# Patient Record
Sex: Female | Born: 1975 | Race: White | Hispanic: No | Marital: Single | Smoking: Former smoker
Health system: Southern US, Community
[De-identification: ages and names within clinical notes are randomized; demographics above are authoritative.]

## PROBLEM LIST (undated history)

## (undated) DIAGNOSIS — R109 Unspecified abdominal pain: Principal | ICD-10-CM

## (undated) DIAGNOSIS — M1990 Unspecified osteoarthritis, unspecified site: Secondary | ICD-10-CM

## (undated) DIAGNOSIS — N200 Calculus of kidney: Secondary | ICD-10-CM

## (undated) DIAGNOSIS — N289 Disorder of kidney and ureter, unspecified: Secondary | ICD-10-CM

## (undated) DIAGNOSIS — Z1231 Encounter for screening mammogram for malignant neoplasm of breast: Secondary | ICD-10-CM

## (undated) DIAGNOSIS — M5412 Radiculopathy, cervical region: Secondary | ICD-10-CM

## (undated) DIAGNOSIS — N3281 Overactive bladder: Secondary | ICD-10-CM

## (undated) DIAGNOSIS — Z0289 Encounter for other administrative examinations: Secondary | ICD-10-CM

## (undated) DIAGNOSIS — N8000 Endometriosis of the uterus, unspecified: Secondary | ICD-10-CM

## (undated) DIAGNOSIS — M5020 Other cervical disc displacement, unspecified cervical region: Secondary | ICD-10-CM

## (undated) HISTORY — PX: TUBAL LIGATION: SHX77

---

## 1997-10-10 ENCOUNTER — Inpatient Hospital Stay (HOSPITAL_COMMUNITY): Admission: AD | Admit: 1997-10-10 | Discharge: 1997-10-12 | Payer: Self-pay | Admitting: *Deleted

## 1999-07-08 ENCOUNTER — Emergency Department (HOSPITAL_COMMUNITY): Admission: EM | Admit: 1999-07-08 | Discharge: 1999-07-08 | Payer: Self-pay | Admitting: Emergency Medicine

## 1999-07-08 ENCOUNTER — Encounter: Payer: Self-pay | Admitting: Emergency Medicine

## 2000-07-16 ENCOUNTER — Encounter: Payer: Self-pay | Admitting: *Deleted

## 2000-07-16 ENCOUNTER — Ambulatory Visit (HOSPITAL_COMMUNITY): Admission: RE | Admit: 2000-07-16 | Discharge: 2000-07-16 | Payer: Self-pay | Admitting: *Deleted

## 2000-10-26 ENCOUNTER — Inpatient Hospital Stay (HOSPITAL_COMMUNITY): Admission: AD | Admit: 2000-10-26 | Discharge: 2000-10-26 | Payer: Self-pay | Admitting: *Deleted

## 2000-11-08 ENCOUNTER — Inpatient Hospital Stay (HOSPITAL_COMMUNITY): Admission: AD | Admit: 2000-11-08 | Discharge: 2000-11-08 | Payer: Self-pay | Admitting: *Deleted

## 2000-12-19 ENCOUNTER — Inpatient Hospital Stay (HOSPITAL_COMMUNITY): Admission: AD | Admit: 2000-12-19 | Discharge: 2000-12-21 | Payer: Self-pay | Admitting: *Deleted

## 2000-12-27 ENCOUNTER — Inpatient Hospital Stay (HOSPITAL_COMMUNITY): Admission: AD | Admit: 2000-12-27 | Discharge: 2000-12-27 | Payer: Self-pay | Admitting: *Deleted

## 2001-01-30 ENCOUNTER — Encounter: Payer: Self-pay | Admitting: *Deleted

## 2001-01-30 ENCOUNTER — Ambulatory Visit (HOSPITAL_COMMUNITY): Admission: RE | Admit: 2001-01-30 | Discharge: 2001-01-30 | Payer: Self-pay | Admitting: *Deleted

## 2002-08-11 ENCOUNTER — Emergency Department (HOSPITAL_COMMUNITY): Admission: EM | Admit: 2002-08-11 | Discharge: 2002-08-11 | Payer: Self-pay | Admitting: Emergency Medicine

## 2002-08-12 ENCOUNTER — Emergency Department (HOSPITAL_COMMUNITY): Admission: EM | Admit: 2002-08-12 | Discharge: 2002-08-12 | Payer: Self-pay | Admitting: Emergency Medicine

## 2003-02-08 ENCOUNTER — Emergency Department (HOSPITAL_COMMUNITY): Admission: EM | Admit: 2003-02-08 | Discharge: 2003-02-08 | Payer: Self-pay | Admitting: Emergency Medicine

## 2003-12-01 ENCOUNTER — Emergency Department (HOSPITAL_COMMUNITY): Admission: EM | Admit: 2003-12-01 | Discharge: 2003-12-01 | Payer: Self-pay | Admitting: Emergency Medicine

## 2003-12-21 ENCOUNTER — Emergency Department (HOSPITAL_COMMUNITY): Admission: EM | Admit: 2003-12-21 | Discharge: 2003-12-21 | Payer: Self-pay | Admitting: Emergency Medicine

## 2004-04-09 ENCOUNTER — Emergency Department (HOSPITAL_COMMUNITY): Admission: EM | Admit: 2004-04-09 | Discharge: 2004-04-09 | Payer: Self-pay | Admitting: Emergency Medicine

## 2005-02-28 ENCOUNTER — Emergency Department (HOSPITAL_COMMUNITY): Admission: EM | Admit: 2005-02-28 | Discharge: 2005-02-28 | Payer: Self-pay | Admitting: Family Medicine

## 2006-07-18 ENCOUNTER — Emergency Department (HOSPITAL_COMMUNITY): Admission: EM | Admit: 2006-07-18 | Discharge: 2006-07-18 | Payer: Self-pay | Admitting: Family Medicine

## 2006-12-16 ENCOUNTER — Emergency Department (HOSPITAL_COMMUNITY): Admission: EM | Admit: 2006-12-16 | Discharge: 2006-12-16 | Payer: Self-pay | Admitting: Family Medicine

## 2007-01-04 ENCOUNTER — Emergency Department (HOSPITAL_COMMUNITY): Admission: EM | Admit: 2007-01-04 | Discharge: 2007-01-04 | Payer: Self-pay | Admitting: Family Medicine

## 2007-01-07 ENCOUNTER — Emergency Department (HOSPITAL_COMMUNITY): Admission: EM | Admit: 2007-01-07 | Discharge: 2007-01-07 | Payer: Self-pay | Admitting: Emergency Medicine

## 2007-01-20 ENCOUNTER — Ambulatory Visit (HOSPITAL_COMMUNITY): Admission: RE | Admit: 2007-01-20 | Discharge: 2007-01-20 | Payer: Self-pay | Admitting: Orthopedic Surgery

## 2007-06-08 ENCOUNTER — Emergency Department (HOSPITAL_COMMUNITY): Admission: EM | Admit: 2007-06-08 | Discharge: 2007-06-08 | Payer: Self-pay | Admitting: Family Medicine

## 2008-03-14 ENCOUNTER — Emergency Department (HOSPITAL_COMMUNITY): Admission: EM | Admit: 2008-03-14 | Discharge: 2008-03-14 | Payer: Self-pay | Admitting: Emergency Medicine

## 2008-04-20 ENCOUNTER — Emergency Department (HOSPITAL_COMMUNITY): Admission: EM | Admit: 2008-04-20 | Discharge: 2008-04-20 | Payer: Self-pay | Admitting: Emergency Medicine

## 2008-07-16 MED ORDER — IOVERSOL 350 MG/ML IV SOLN
350 mg iodine/mL | Freq: Once | INTRAVENOUS | Status: DC
Start: 2008-07-16 — End: 2008-07-20

## 2008-07-16 MED ORDER — BARIUM SULFATE 2.2 % ORAL SUSP
2.2 % | Freq: Once | ORAL | Status: DC
Start: 2008-07-16 — End: 2008-07-20

## 2009-10-17 ENCOUNTER — Emergency Department (HOSPITAL_COMMUNITY): Admission: EM | Admit: 2009-10-17 | Discharge: 2009-10-17 | Payer: Self-pay | Admitting: Emergency Medicine

## 2009-10-30 ENCOUNTER — Emergency Department (HOSPITAL_COMMUNITY): Admission: EM | Admit: 2009-10-30 | Discharge: 2009-10-30 | Payer: Self-pay | Admitting: Emergency Medicine

## 2010-09-12 ENCOUNTER — Encounter (INDEPENDENT_AMBULATORY_CARE_PROVIDER_SITE_OTHER): Payer: Self-pay | Admitting: Emergency Medicine

## 2010-09-12 ENCOUNTER — Emergency Department (HOSPITAL_COMMUNITY)
Admission: EM | Admit: 2010-09-12 | Discharge: 2010-09-12 | Payer: Self-pay | Source: Home / Self Care | Admitting: Emergency Medicine

## 2010-09-17 ENCOUNTER — Emergency Department (HOSPITAL_COMMUNITY)
Admission: EM | Admit: 2010-09-17 | Discharge: 2010-09-17 | Payer: Self-pay | Source: Home / Self Care | Admitting: Emergency Medicine

## 2010-09-24 ENCOUNTER — Encounter: Payer: Self-pay | Admitting: Neurosurgery

## 2010-11-23 LAB — D-DIMER, QUANTITATIVE: D-Dimer, Quant: <0.22 ug/mL-FEU (ref 0.00–0.48)

## 2012-02-04 ENCOUNTER — Encounter (HOSPITAL_COMMUNITY): Payer: Self-pay | Admitting: Emergency Medicine

## 2012-02-04 ENCOUNTER — Emergency Department (HOSPITAL_COMMUNITY)
Admission: EM | Admit: 2012-02-04 | Discharge: 2012-02-04 | Disposition: A | Discharge status: Home / Self Care | Payer: Self-pay | Attending: Emergency Medicine | Admitting: Emergency Medicine

## 2012-02-04 DIAGNOSIS — J020 Streptococcal pharyngitis: Secondary | ICD-10-CM | POA: Insufficient documentation

## 2012-02-04 MED ORDER — IBUPROFEN 200 MG PO TABS: 400.0000 mg | ORAL_TABLET | Freq: Once | ORAL | Status: DC

## 2012-02-04 MED ORDER — PENICILLIN G BENZATHINE 1200000 UNIT/2ML IM SUSP
1.2000 10*6.[IU] | Freq: Once | INTRAMUSCULAR | Status: AC
  Administered 2012-02-04: 1.2 10*6.[IU] via INTRAMUSCULAR
  Filled 2012-02-04 (×2): qty 2

## 2012-02-04 MED ORDER — DEXAMETHASONE 1 MG/ML PO CONC
5.0000 mg | Freq: Once | ORAL | Status: AC
  Administered 2012-02-04: 5 mg via ORAL
  Filled 2012-02-04: qty 5

## 2012-02-04 NOTE — ED Notes (Signed)
Pt c/o sore throat for 3 days and it hurts to swallow. Today she also cough. Pt unsure if she has had a fever and has not "felt bad" just having sore throat.

## 2012-02-04 NOTE — ED Provider Notes (Signed)
History    35yf with sore throat. Gradual onset and progressively worsening over past 3 days. Denies pain anywhere else. Has been taking BC and tylenol with minor relief. Subjective fever. No n/v. No sob. No cough. No change in voice. No drooling.  CSN: [id number]  Arrival date & time 02/04/12  1015   First MD Initiated Contact with Patient 02/04/12 1029      No chief complaint on file.   (Consider location/radiation/quality/duration/timing/severity/associated sxs/prior treatment) HPI  History reviewed. No pertinent past medical history.  History reviewed. No pertinent past surgical history.  No family history on file.  History  Substance Use Topics  . Smoking status: Never Smoker   . Smokeless tobacco: Not on file  . Alcohol Use: No    OB History    Grav Para Term Preterm Abortions TAB SAB Ect Mult Living                  Review of Systems   Review of symptoms negative unless otherwise noted in HPI.   Allergies  Codeine  Home Medications  No current outpatient prescriptions on file.  BP 135/62  Pulse 70  Temp 98.7 F (37.1 C)  Resp 18  SpO2 98%  LMP 02/04/2012  Physical Exam  Nursing note and vitals reviewed. Constitutional: She appears well-developed and well-nourished. No distress.  HENT:  Head: Normocephalic and atraumatic.  Mouth/Throat: Oropharyngeal exudate present.       B/l tonsillar exudate. Posterior pharynx injected. Uvula midline. Handling secretions. No stridor. No hoarsenss. No tongue elevation. Submental tissues soft. No adenopathy appreciated. Neck supple.  Eyes: Conjunctivae are normal. Right eye exhibits no discharge. Left eye exhibits no discharge.  Neck: Neck supple.  Cardiovascular: Normal rate, regular rhythm and normal heart sounds.  Exam reveals no gallop and no friction rub.   No murmur heard. Pulmonary/Chest: Effort normal and breath sounds normal. No respiratory distress.  Musculoskeletal: She exhibits no edema and no  tenderness.  Neurological: She is alert.  Skin: Skin is warm and dry.  Psychiatric: She has a normal mood and affect. Her behavior is normal. Thought content normal.    ED Course  Procedures (including critical care time)  Labs Reviewed - No data to display No results found.   1. Streptococcal pharyngitis       MDM  35yF with sore throat. 3/4 centor criteria if including reported fever. Will tx for strep based on this. No evidence of deep space infection. Pt agreeable to bicillin. nsaid and dose decadron for symptomatic relief. Return precautions discussed. outpt fu as needed.        Raeford Razor, MD 02/04/12 1121

## 2012-02-04 NOTE — Discharge Instructions (Signed)
Strep Throat Strep throat is an infection of the throat caused by a bacteria named Streptococcus pyogenes. Your caregiver may call the infection streptococcal "tonsillitis" or "pharyngitis" depending on whether there are signs of inflammation in the tonsils or back of the throat. Strep throat is most common in children from 68 to 38 years old during the cold months of the year, but it can occur in people of any age during any season. This infection is spread from person to person (contagious) through coughing, sneezing, or other close contact. SYMPTOMS   Fever or chills.   Painful, swollen, red tonsils or throat.   Pain or difficulty when swallowing.   White or yellow spots on the tonsils or throat.   Swollen, tender lymph nodes or "glands" of the neck or under the jaw.   Red rash all over the body (rare).  DIAGNOSIS  Many different infections can cause the same symptoms. A test must be done to confirm the diagnosis so the right treatment can be given. A "rapid strep test" can help your caregiver make the diagnosis in a few minutes. If this test is not available, a light swab of the infected area can be used for a throat culture test. If a throat culture test is done, results are usually available in a day or two. TREATMENT  Strep throat is treated with antibiotic medicine. HOME CARE INSTRUCTIONS   Gargle with 1 tsp of salt in 1 cup of warm water, 3 to 4 times per day or as needed for comfort.   Family members who also have a sore throat or fever should be tested for strep throat and treated with antibiotics if they have the strep infection.   Make sure everyone in your household washes their hands well.   Do not share food, drinking cups, or personal items that could cause the infection to spread to others.   You may need to eat a soft food diet until your sore throat gets better.   Drink enough water and fluids to keep your urine clear or pale yellow. This will help prevent  dehydration.   Get plenty of rest.   Stay home from school, daycare, or work until you have been on antibiotics for 24 hours.   Only take over-the-counter or prescription medicines for pain, discomfort, or fever as directed by your caregiver.   If antibiotics are prescribed, take them as directed. Finish them even if you start to feel better.  SEEK MEDICAL CARE IF:   The glands in your neck continue to enlarge.   You develop a rash, cough, or earache.   You cough up green, yellow-brown, or bloody sputum.   You have pain or discomfort not controlled by medicines.   Your problems seem to be getting worse rather than better.  SEEK IMMEDIATE MEDICAL CARE IF:   You develop any new symptoms such as vomiting, severe headache, stiff or painful neck, chest pain, shortness of breath, or trouble swallowing.   You develop severe throat pain, drooling, or changes in your voice.   You develop swelling of the neck, or the skin on the neck becomes red and tender.   You have a fever.   You develop signs of dehydration, such as fatigue, dry mouth, and decreased urination.   You become increasingly sleepy, or you cannot wake up completely.  Document Released: 08/17/2000 Document Revised: 08/09/2011 Document Reviewed: 10/19/2010 [facility] Patient Information 2012 [place], [place].  RESOURCE GUIDE  Chronic Pain Problems: Contact [facility]  [phone] Patients need to be referred by their primary care doctor.  Insufficient Money for Medicine: Contact United Way:  call "211" or [facility] 440-758-1291.  No Primary Care Doctor: - Call [facility]  [phone] - can help you locate a primary care doctor that  accepts your insurance, provides certain services, etc. - Physician Referral Service- [phone]  Agencies that provide inexpensive medical care: - [facility]  [phone] - [facility]  [phone] - [facility]  [phone] - Women's Clinic  [phone] - Planned Parenthood  [phone] [facility]  [phone]  Medicaid-accepting [facility] Providers: - [facility]- [address]  [phone], Mon-Fri 9am-7pm, Sat 9am-1pm - [facility]- [address] [place], [address] [place]  [phone] - [facility]- [address] [place]  [phone] [facility]- [address]  [phone] - Renaye Rakers- [address] [place], [address], [phone]  Only accepts [place] patients after they have their name  applied to their card  Self Pay (no insurance) in [place]: - Sickle Cell Patients: Dr Willey Blade, [facility]  [address] [place], [phone] - [facility]- [address] [place]  [phone]       [facility]- [address]       -     [facility]- see information above (Speak to [employer] if you do not have insurance)       -  [facility]- [address] [place], [phone]       -  [facility]- [address],  [phone]       -  [facility]- [address], [phone]       -  Dr Julio Sicks-  [address], [place], [phone]       -  [facility]- [address], [phone]       -  [facility]- [address] [phone], also [address], [phone]       -    [facility]- [address] [place], [phone], 1st & 3rd Saturday   every month, 10am-1pm  1) Find a Doctor and Pay Out of Pocket Although you won't have to find out who is covered by your insurance plan, it is a good idea to ask around and get recommendations. You will then need to call the office and see if the doctor you have chosen will accept you as a new patient and what types of options they offer for patients who are self-pay. Some doctors offer discounts or will  set up payment plans for their patients who do not have insurance, but you will need to ask so you aren't surprised when you get to your appointment.  2) Contact Your Local Health Department Not all health departments have doctors that can see patients for sick visits, but many do, so it is worth a call to see if yours does. If you don't know where your local health department is, you can check in your phone book. The CDC also has a tool to help you locate your state's health department, and many state websites also have listings of all of their local health departments.  3) Find a Walk-in Clinic If your illness is not likely to be very severe or complicated, you may want to try a walk in clinic. These are popping up all over the country in pharmacies, drugstores, and shopping centers. They're usually staffed by nurse practitioners or physician assistants that have been trained to treat common illnesses and complaints. They're usually fairly quick and inexpensive. However, if you have serious medical issues or chronic medical problems, these are probably not your best option  STD Testing - [facility] Department of [facility], STD Clinic, [address], [place], phone [phone] or [phone].  Monday - Friday, call for an appointment. [facility] [place] Department of [employer], STD Clinic, [address], [place], phone [phone] or [phone].  Monday - Friday, call for an appointment.  Abuse/Neglect: [facility] Child Abuse Hotline 3342443444 [facility] Child Abuse Hotline (317) 127-1401 (After Hours)  Emergency Shelter:  [facility] 272-432-5871  Maternity Homes: - [facility] at [facility] [place] [facility] 803-333-2139 - [facility] [phone]  MRSA Hotline #:   867-006-4595  [facility] Resources  [facility] [place]  [employer] [facility] Dept. [address]                  [address]         [address] [place] [address]  Blondell Reveal Phone:  [phone]                                  Phone:  [phone]                   Phone:  [phone]  [facility], [phone] - [phone]       -     [facility] in [place], [address]                                  [phone], [facility] Child Abuse Hotline 313-285-1421 or (701)502-2130 (After Hours)   Behavioral Health Services  Substance Abuse Resources: - [facility] Drug [facility]  832-111-1070 - [facility] (929)368-5249 - [facility] [phone] Floydene Flock [phone] - Residential & Outpatient Substance Abuse Program  6782426249  Psychological Services: [facility]  [phone] Services  [phone] - [facility], [phone] [place]. [address], [place], ACCESS LINE: 719-767-8465 or 5597667307, [email or web address]  Dental Assistance  If unable to pay or uninsured, contact:  [facility] or [facility]. to become qualified for the adult dental clinic.  Patients with Medicaid: [facility] [phone], [phone] [address] [phone]  If unable to pay, or uninsured, contact [facility] [phone]) or [facility]  Health Department [phone] in [place], [phone] in [facility]) to become qualified for the adult dental clinic  Other Low-Cost Community Dental Services: - [facility]- [address] [place], [place], [place], [address], [phone], [phone], 2nd and 4th Thursday of the month at 6:30am.  10 clients each day by appointment, can sometimes see walk-in patients if someone does not show for an appointment. [facility]-  [address] [place]ins [place], [place], [address], [phone] - [facility]- [address] [place], [place], [address], [phone] - [facility]- [phone] [facility] Health Department- [phone] [facility] Department- [phone]

## 2012-05-05 ENCOUNTER — Encounter (HOSPITAL_COMMUNITY): Payer: Self-pay | Admitting: *Deleted

## 2012-05-05 ENCOUNTER — Emergency Department (HOSPITAL_COMMUNITY)
Admission: EM | Admit: 2012-05-05 | Discharge: 2012-05-05 | Disposition: A | Discharge status: Home / Self Care | Payer: Self-pay | Attending: Emergency Medicine | Admitting: Emergency Medicine

## 2012-05-05 DIAGNOSIS — R609 Edema, unspecified: Secondary | ICD-10-CM | POA: Insufficient documentation

## 2012-05-05 DIAGNOSIS — R600 Localized edema: Secondary | ICD-10-CM

## 2012-05-05 DIAGNOSIS — Z886 Allergy status to analgesic agent status: Secondary | ICD-10-CM | POA: Insufficient documentation

## 2012-05-05 LAB — URINALYSIS, ROUTINE W REFLEX MICROSCOPIC
Bilirubin Urine: NEGATIVE
Hgb urine dipstick: NEGATIVE
Ketones, ur: NEGATIVE mg/dL
Nitrite: NEGATIVE
Protein, ur: NEGATIVE mg/dL
Urobilinogen, UA: 1 mg/dL (ref 0.0–1.0)

## 2012-05-05 LAB — POCT I-STAT, CHEM 8
Calcium, Ion: 1.24 mmol/L — ABNORMAL HIGH (ref 1.12–1.23)
Creatinine, Ser: 0.7 mg/dL (ref 0.50–1.10)
Glucose, Bld: 91 mg/dL (ref 70–99)
HCT: 35 % — ABNORMAL LOW (ref 36.0–46.0)
Hemoglobin: 11.9 g/dL — ABNORMAL LOW (ref 12.0–15.0)
Potassium: 3.9 mEq/L (ref 3.5–5.1)

## 2012-05-05 LAB — PREGNANCY, URINE: Preg Test, Ur: NEGATIVE

## 2012-05-05 NOTE — ED Provider Notes (Signed)
History     CSN: [id number]  Arrival date & time 05/05/12  1048   First MD Initiated Contact with Patient 05/05/12 1116      Chief Complaint  Patient presents with  . Leg Swelling     HPI Pt was seen at 1130.  Per pt, c/o gradual onset and persistence of constant bilat feet "swelling" for the past 1 week.  Denies recent long travel, no calf/LE pain or unilateral swelling, no fevers, no rash, no CP/SOB, no focal motor weakness, no tingling/numbness in extremities, no abd pain, no back pain.     History reviewed. No pertinent past medical history.  History reviewed. No pertinent past surgical history.   History  Substance Use Topics  . Smoking status: Never Smoker   . Smokeless tobacco: Not on file  . Alcohol Use: No    Review of Systems ROS: Statement: All systems negative except as marked or noted in the HPI; Constitutional: Negative for fever and chills. ; ; Eyes: Negative for eye pain, redness and discharge. ; ; ENMT: Negative for ear pain, hoarseness, nasal congestion, sinus pressure and sore throat. ; ; Cardiovascular: Negative for chest pain, palpitations, diaphoresis, dyspnea and +peripheral edema. ; ; Respiratory: Negative for cough, wheezing and stridor. ; ; Gastrointestinal: Negative for nausea, vomiting, diarrhea, abdominal pain, blood in stool, hematemesis, jaundice and rectal bleeding. . ; ; Genitourinary: Negative for dysuria, flank pain and hematuria. ; ; Musculoskeletal: Negative for back pain and neck pain. Negative for swelling and trauma.; ; Skin: Negative for pruritus, rash, abrasions, blisters, bruising and skin lesion.; ; Neuro: Negative for headache, lightheadedness and neck stiffness. Negative for weakness, altered level of consciousness , altered mental status, extremity weakness, paresthesias, involuntary movement, seizure and syncope.     Allergies  Codeine  Home Medications   Current Outpatient Rx  Name Route Sig Dispense Refill  . IBUPROFEN 200 MG  PO TABS Oral Take 400 mg by mouth every 6 (six) hours as needed. Pain    . ADULT MULTIVITAMIN W/MINERALS CH Oral Take 1 tablet by mouth daily.      BP 118/62  Pulse 66  Temp 98.8 F (37.1 C) (Oral)  SpO2 98%  LMP 04/28/2012  Physical Exam 1135: Physical examination:  Nursing notes reviewed; Vital signs and O2 SAT reviewed;  Constitutional: Well developed, Well nourished, Well hydrated, In no acute distress; Head:  Normocephalic, atraumatic; Eyes: EOMI, PERRL, No scleral icterus; ENMT: Mouth and pharynx normal, Mucous membranes moist; Neck: Supple, Full range of motion, No lymphadenopathy; Cardiovascular: Regular rate and rhythm, No murmur, rub, or gallop; Respiratory: Breath sounds clear & equal bilaterally, No rales, rhonchi, wheezes.  Speaking full sentences with ease, Normal respiratory effort/excursion; Chest: Nontender, Movement normal; Abdomen: Soft, Nontender, Nondistended, Normal bowel sounds;; Extremities: Pulses normal, No tenderness, Trace to +1 bilat pedal edema to feet and ankles. No calf edema or asymmetry.; Neuro: AA&Ox3, Major CN grossly intact.  Speech clear. No gross focal motor or sensory deficits in extremities.; Skin: Color normal, Warm, Dry.   ED Course  Procedures    MDM  MDM Reviewed: nursing note and vitals Interpretation: labs     Results for orders placed during the hospital encounter of 05/05/12  PREGNANCY, URINE      Component Value Range   Preg Test, Ur NEGATIVE  NEGATIVE  URINALYSIS, ROUTINE W REFLEX MICROSCOPIC      Component Value Range   Color, Urine YELLOW  YELLOW   APPearance CLEAR  CLEAR   Specific  Gravity, Urine 1.022  1.005 - 1.030   pH 7.0  5.0 - 8.0   Glucose, UA NEGATIVE  NEGATIVE mg/dL   Hgb urine dipstick NEGATIVE  NEGATIVE   Bilirubin Urine NEGATIVE  NEGATIVE   Ketones, ur NEGATIVE  NEGATIVE mg/dL   Protein, ur NEGATIVE  NEGATIVE mg/dL   Urobilinogen, UA 1.0  0.0 - 1.0 mg/dL   Nitrite NEGATIVE  NEGATIVE   Leukocytes, UA NEGATIVE   NEGATIVE  POCT I-STAT, CHEM 8      Component Value Range   Sodium 139  135 - 145 mEq/L   Potassium 3.9  3.5 - 5.1 mEq/L   Chloride 104  96 - 112 mEq/L   BUN 9  6 - 23 mg/dL   Creatinine, Ser 1.61  0.50 - 1.10 mg/dL   Glucose, Bld 91  70 - 99 mg/dL   Calcium, Ion 0.96 (*) 1.12 - 1.23 mmol/L   TCO2 25  0 - 100 mmol/L   Hemoglobin 11.9 (*) 12.0 - 15.0 g/dL   HCT 04.5 (*) 40.9 - 81.1 %     1340:  No acute findings on labs.  Low risk Wells' criteria and bilat feet swelling (no unilateral leg pain/swelling); doubt DVT .  Dx testing d/w pt and family.  Questions answered.  Verb understanding, agreeable to d/c home with outpt f/u.          Laray Anger, DO 05/08/12 1427

## 2012-05-05 NOTE — ED Notes (Signed)
MD at bedside. 

## 2012-05-05 NOTE — ED Notes (Signed)
Last week noted lower legs/ feet swelling, no injury, continues to swell even with propping ext up

## 2012-09-10 LAB — URINALYSIS W/ RFLX MICROSCOPIC
Bacteria: 0 /HPF
Bilirubin: NEGATIVE
Casts: 0 /LPF
Glucose: NEGATIVE MG/DL
Ketone: NEGATIVE MG/DL
Leukocyte Esterase: NEGATIVE
Nitrites: NEGATIVE
Protein: NEGATIVE MG/DL
Specific gravity: 1.011 (ref 1.001–1.023)
Urobilinogen: 0.2 EU/DL (ref 0.2–1.0)
WBC: 0 /HPF
pH (UA): 7.5 (ref 5.0–9.0)

## 2012-09-10 LAB — METABOLIC PANEL, BASIC
Anion gap: 5 mmol/L — ABNORMAL LOW (ref 7–16)
BUN: 10 MG/DL (ref 6–23)
CO2: 31 MMOL/L (ref 21–32)
Calcium: 10.1 MG/DL (ref 8.3–10.4)
Chloride: 104 MMOL/L (ref 98–107)
Creatinine: 0.54 MG/DL — ABNORMAL LOW (ref 0.6–1.0)
GFR est AA: >60 mL/min/{1.73_m2} (ref >60)
GFR est non-AA: >60 mL/min/{1.73_m2} (ref >60)
Glucose: 78 MG/DL (ref 65–100)
Potassium: 4.1 MMOL/L (ref 3.5–5.1)
Sodium: 140 MMOL/L (ref 136–145)

## 2012-09-10 LAB — CBC WITH AUTOMATED DIFF
ABS. BASOPHILS: 0 10*3/uL (ref 0.0–0.2)
ABS. EOSINOPHILS: 0.2 10*3/uL (ref 0.0–0.8)
ABS. IMM. GRANS.: 0 10*3/uL (ref 0.0–0.5)
ABS. LYMPHOCYTES: 2.8 10*3/uL (ref 0.5–4.6)
ABS. MONOCYTES: 0.7 10*3/uL (ref 0.1–1.3)
ABS. NEUTROPHILS: 5.6 10*3/uL (ref 1.7–8.2)
BASOPHILS: 0 % (ref 0.0–2.0)
EOSINOPHILS: 3 % (ref 0.5–7.8)
HCT: 39.2 % (ref 35.8–46.3)
HGB: 12.7 g/dL (ref 11.7–15.4)
IMMATURE GRANULOCYTES: 0.1 % (ref 0.0–5.0)
LYMPHOCYTES: 30 % (ref 13–44)
MCH: 29.2 PG (ref 26.1–32.9)
MCHC: 32.4 g/dL (ref 31.4–35.0)
MCV: 90.1 FL (ref 79.6–97.8)
MONOCYTES: 8 % (ref 4.0–12.0)
MPV: 10.2 FL — ABNORMAL LOW (ref 10.8–14.1)
NEUTROPHILS: 59 % (ref 43–78)
PLATELET: 332 10*3/uL (ref 150–450)
RBC: 4.35 M/uL (ref 4.05–5.25)
RDW: 12.4 % (ref 11.9–14.6)
WBC: 9.3 10*3/uL (ref 4.3–11.1)

## 2012-09-10 LAB — MSSA/MRSA SC BY PCR, NASAL SWAB

## 2012-09-10 NOTE — Progress Notes (Signed)
Preoperative Assessment Spine Surgery    Surgeon:   Toney Sang, MD    Date of Surgery:  09/15/12    Surgery Planned:    Anterior Cervical Discectomy and Fusion                    Anesthesia Plan: General Anesthesia    Primary Care Physician:   [facility]    Specialty Physician(s):      Subjective:     Marissa Perry is a 37 y.o. Caucasian female who presents for preoperative risk assessment evaluation for the above mentioned surgery on 09/15/12.      Past Medical History   Diagnosis Date   ??? Anxiety      no present treatment       Past Surgical History   Procedure Laterality Date   ??? Hx cesarean section  1993     Family History   Problem Relation Age of Onset   ??? Hypertension Father       History   Substance Use Topics   ??? Smoking status: Former Smoker -- 0.50 packs/day for 6 years   ??? Smokeless tobacco: Never Used      Comment: stopped in 2013   ??? Alcohol Use: Yes      Comment: Rare       Prior to Admission medications    Medication Sig Start Date End Date Taking? Authorizing Provider   cyclobenzaprine (FLEXERIL) 10 mg tablet Take 10 mg by mouth three (3) times daily as needed for Muscle Spasm(s).    Historical Provider   norethindrone-ethinyl estradiol (BALZIVA, 28,) 0.4-35 mg-mcg per tablet Take 1 Tab by mouth every morning.    Historical Provider   DM/P-EPHED/ACETAMINOPH/DOXYLAM (NIGHT TIME COLD FORMULA PO) Take  by mouth as needed.    Historical Provider     No Known Allergies     Review of Systems   Denies Past Medical History of:  AMI, CHF, Valve disorder, PVD, TIA, CVA, DVT, Pulmonary Embolism, COPD, asthma, kidney disease, known bleeding disorder or anemia.                            Constitutional: Negative for fever, chills, weight loss and malaise/fatigue.                           HENT: Negative for hearing loss. does not use hearing aids   Eyes: Negative for blurred vision. does not wear glasses   Respiratory: Negative for cough, shortness of breath and wheezing.                              Cardiovascular: Negative for chest pain, palpitations, orthopnea and leg swelling.              Gastrointestinal: Negative for heartburn, nausea, vomiting, diarrhea, constipation and blood in stool.                            Genitourinary: Negative for dysuria, urgency, frequency and hematuria.                             Musculoskeletal: Negative for falls. See HPI  Skin: Negative for itching and rash.                            Neurological: Negative for dizziness and seizures.                             Endo/Heme/Allergies: Negative for polydipsia. Does not bruise/bleed easily.                     Psychiatric/Behavioral: Negative for depression, hallucinations, memory loss and substance abuse. The patient is not nervous/anxious.                             Objective:     Physical Exam:   Blood pressure 112/68, pulse 80, temperature 98.8 ??F (37.1 ??C), resp. rate 16, height 5\' 3"  (1.6 m), weight 84.369 kg (186 lb), last menstrual period 09/09/2012.  Patient Vitals for the past 8 hrs:   BP Temp Pulse Resp Height Weight   09/10/12 1049 - - - - 5\' 3"  (1.6 m) 84.369 kg (186 lb)   09/10/12 1037 112/68 mmHg 98.8 ??F (37.1 ??C) 80 16 - -     Constitutional: Oriented to person, place, and time. Appears well-developed and well-nourished.   Head: Normocephalic and atraumatic.   Eyes: Conjunctivae are normal. No scleral icterus.   Neck: Normal range of motion. Neck supple. Carotid bruit is not present. No tracheal deviation present.   Cardiovascular: Normal rate, regular rhythm and normal heart sounds.    Pulmonary/Chest: Effort normal and breath sounds normal.   Abdominal: Soft. Bowel sounds are normal.   Musculoskeletal: Exhibits no edema.   Neurological: Alert and oriented to person, place, and time.   Skin: Skin is warm and dry. No rash noted.   Psychiatric: Has a normal mood and affect. Behavior is normal.     Data Review:   Recent Results (from the past 24 hour(s))   URINALYSIS W/ RFLX  MICROSCOPIC    Collection Time    09/10/12 10:30 AM       Result Value Range    Color YELLOW      Appearance CLEAR      Specific gravity 1.011  1.001 - 1.023      pH 7.5  5.0 - 9.0      Protein NEGATIVE   NEGATIVE MG/DL    Glucose NEGATIVE       Ketone NEGATIVE   NEGATIVE MG/DL    Bilirubin NEGATIVE   NEGATIVE    Blood MODERATE (*) NEGATIVE    Urobilinogen 0.2  0.2 - 1.0 EU/DL    Nitrites NEGATIVE   NEGATIVE    Leukocyte Esterase NEGATIVE   NEGATIVE    WBC 0  0 /HPF    RBC 3-5  0 /HPF    Epithelial cells 0-3  0 /HPF    Bacteria 0  0 /HPF    Casts 0  0 /LPF   CBC WITH AUTOMATED DIFF    Collection Time    09/10/12 10:35 AM       Result Value Range    WBC 9.3  4.3 - 11.1 K/uL    RBC 4.35  4.05 - 5.25 M/uL    HGB 12.7  11.7 - 15.4 g/dL    HCT 16.1  09.6 - 04.5 %    MCV 90.1  79.6 - 97.8  FL    MCH 29.2  26.1 - 32.9 PG    MCHC 32.4  31.4 - 35.0 g/dL    RDW 54.0  98.1 - 19.1 %    PLATELET 332  150 - 450 K/uL    MPV 10.2 (*) 10.8 - 14.1 FL    DF AUTOMATED      NEUTROPHILS 59  43 - 78 %    LYMPHOCYTES 30  13 - 44 %    MONOCYTES 8  4.0 - 12.0 %    EOSINOPHILS 3  0.5 - 7.8 %    BASOPHILS 0  0.0 - 2.0 %    IMMATURE GRANULOCYTES 0.1  0.0 - 5.0 %    ABS. NEUTROPHILS 5.6  1.7 - 8.2 K/UL    ABS. LYMPHOCYTES 2.8  0.5 - 4.6 K/UL    ABS. MONOCYTES 0.7  0.1 - 1.3 K/UL    ABS. EOSINOPHILS 0.2  0.0 - 0.8 K/UL    ABS. BASOPHILS 0.0  0.0 - 0.2 K/UL    ABS. IMM. GRANS. 0.0  0.0 - 0.5 K/UL   METABOLIC PANEL, BASIC    Collection Time    09/10/12 10:35 AM       Result Value Range    Sodium 140  136 - 145 MMOL/L    Potassium 4.1  3.5 - 5.1 MMOL/L    Chloride 104  98 - 107 MMOL/L    CO2 31  21 - 32 MMOL/L    Anion gap 5 (*) 7 - 16 mmol/L    Glucose 78  65 - 100 MG/DL    BUN 10  6 - 23 MG/DL    Creatinine 4.78 (*) 0.6 - 1.0 MG/DL    GFR est AA >29  >56 ml/min/1.3m2    GFR est non-AA >60  >60 ml/min/1.58m2    Calcium 10.1  8.3 - 10.4 MG/DL   MSSA/MRSA SC BY PCR, NASAL SWAB    Collection Time    09/10/12 10:40 AM       Result Value Range    Specimen  Description: NASAL SWAB      Special Requests: NO SPECIAL REQUESTS      Culture result:        Value: MRSA target DNA not detected, SA target DNA detected.   A MRSA negative, SA positive test result does not preclude MRSA nasal colonization.    Report Status 09/10/2012 FINAL       No results found for this basename: HBA1C         Risk Assessment:     Duke Activity Index: Climb a flight of stairs or walk uphill? (5.50 METS)   Major Cardiac Risk: None does not have Active Cardiac Conditions   Intermediate Cardiac Risk: None Risk Assessment = 0  >2 consider B-Blocker and EKG   Minor Cardiac Risk: None does not have minor risk predictors   STOP-Bang Screen for Sleep Apnea: none      Pulmonary Risk: None Pulmonary Flags:None   Renal Risk: None Renal Flags:None   Delirium Risk: None Delirium Flags:None   Ileus Risk: None    DVT Risk: oral contraceptive therapy    UTI Risk:         Assessment / Plan     Problem List:  There is no problem list on file for this patient.        Cardiac Risk Assessment: The patient does not have active cardiac abnormalities noted.   The patient is not a risk for  cardiac complications.   Consults needed: NO    DVT Risk Factors: The patient is a risk for DVT. Birth Control    Pulmonary Risk Assessment:  The patient is not a risk for pulmonary complications.     Renal Risk Assessment:  The patient is not a risk for renal complications.     Delirium Risk Assessment:  The patient is not a risk for delirium.     Ileus Risk Assessment:  The patient is not a risk for postoperative ileus.     Fall Risk Assessment:  No fall risk factors  Morse Fall Scale = 0. Patient is not a fall risk.    Plan:           1. DVT precautions secondary to birth control  2. Check labs - wnl    Signed By: Wilmon Pali, PA-C    September 10, 2012

## 2012-09-10 NOTE — Other (Signed)
Delayed entry: Phone assessment completed. Verbalizes understanding of all preop instructions including place of arrival.  Instructed on NPO past midnight which includes gum, mints, tobacco, ice chips or water.  Aware to have responsible adult drive patient to and from the hospital, stay during entire surgery and to stay with the patient for 24 hours postoperatively. Also instructed to leave all valuables at home, no jewelry. Piercings must be removed prior to surgery.    Patient will be called the day prior to surgery (if Monday, pt will be called on Friday) by preop nurse between 1:00-5:00 PM with arrival time.  Patient is to call [phone] if no one calls them by 5:00 PM day prior to surgery.    Educations sheets on pain management , hand hygiene, smoking cessation ,infection prevention tips along with mupirocin ointment instruction sheet and shower instruction sheet in patient's chart to be given at time of pre-assessment appointment.    Instructed to call surgeon's office if needs to cancel surgery.    Informed to [facility]  with antibacterial soap for 3 days prior to surgery .Instructed to shower with hibiclens- avoiding face and genitals - on the night before and the am of surgery. Let hibiclens sit on skin for 20 seconds and rinse well. No lotion, oil, powders or colognes.Instructed on importance of handwashing. Wear freshly laundered clothing in to hospital, have clean bed linen on bed and do not allow pets to sleep or lay with them. Patient verbalized understanding of all of the above.          Instructed patient to continue previous medications as prescribed prior to surgery and to take [facility]  on the day of surgery with only a sip of water.     Instructed to hold the following medications prior to surgery:none    Type of surgery:3  Labs per surgeon:to be drawn on Prehab day: CBC,BMP, U/A, MRSA  Labs per anesthesia pre-assessment protocol:no added   EKG :none

## 2012-09-10 NOTE — Other (Signed)
MRSA positive for SA only-    Mupirocin nasal ointment 22 GM tube called to [facility] in [place] with instructions pt is to use BID x 5 days to total 10 doses.  Notified pt and instructed on how to administer.  Pt will start tonight.  Notified MD office- spoke to Mason .

## 2012-09-10 NOTE — Other (Signed)
Pt here to view spine video.  MRSA swab done upon arrival.    Pt also had labs, received written instructions including NPO, medications to take on day of surgery; time and place to arrive, use of Hibiclens. Incentive spirometer demonstrated satisfactorily.      All handouts given and pt verbalized understanding of all and had no further questions.    Pt was also evaluated by PA Amy Hunt

## 2012-09-15 ENCOUNTER — Inpatient Hospital Stay
Admit: 2012-09-15 | Discharge: 2012-09-16 | Disposition: A | Discharge status: Home / Self Care | Payer: BLUE CROSS/BLUE SHIELD | Attending: Neurological Surgery | Admitting: Neurological Surgery

## 2012-09-15 DIAGNOSIS — M5020 Other cervical disc displacement, unspecified cervical region: Secondary | ICD-10-CM

## 2012-09-15 LAB — HCG URINE, QL. - POC: Pregnancy test,urine (POC): NEGATIVE

## 2012-09-15 LAB — GLUCOSE, POC: Glucose (POC): 91 mg/dL (ref 65–100)

## 2012-09-15 MED ORDER — LACTATED RINGERS IV
INTRAVENOUS | Status: DC
Start: 2012-09-15 — End: 2012-09-16
  Administered 2012-09-15 – 2012-09-16 (×2): via INTRAVENOUS

## 2012-09-15 MED ORDER — FENTANYL CITRATE (PF) 50 MCG/ML IJ SOLN
50 mcg/mL | INTRAMUSCULAR | Status: DC | PRN
Start: 2012-09-15 — End: 2012-09-15

## 2012-09-15 MED ORDER — CEFAZOLIN 2 GRAM/50 ML NS IVPB
Freq: Three times a day (TID) | INTRAVENOUS | Status: AC
Start: 2012-09-15 — End: 2012-09-16
  Administered 2012-09-15 – 2012-09-16 (×3): via INTRAVENOUS

## 2012-09-15 MED ORDER — MORPHINE 2 MG/ML INJECTION
2 mg/mL | INTRAMUSCULAR | Status: DC | PRN
Start: 2012-09-15 — End: 2012-09-16
  Administered 2012-09-16: 05:00:00 via INTRAVENOUS

## 2012-09-15 MED ORDER — NORETHINDRONE 0.4 MG-ETHINYL ESTRADIOL 35 MCG TABLET
Freq: Every day | ORAL | Status: DC
Start: 2012-09-15 — End: 2012-09-15

## 2012-09-15 MED ORDER — LACTATED RINGERS IV
INTRAVENOUS | Status: DC
Start: 2012-09-15 — End: 2012-09-15
  Administered 2012-09-15: 15:00:00 via INTRAVENOUS

## 2012-09-15 MED ORDER — LIDOCAINE-EPINEPHRINE 1 %-1:100,000 IJ SOLN
1 %-:00,000 | INTRAMUSCULAR | Status: AC
Start: 2012-09-15 — End: ?

## 2012-09-15 MED ORDER — FENTANYL CITRATE (PF) 50 MCG/ML IJ SOLN
50 mcg/mL | INTRAMUSCULAR | Status: DC | PRN
Start: 2012-09-15 — End: 2012-09-15
  Administered 2012-09-15 (×4): via INTRAVENOUS

## 2012-09-15 MED ORDER — DOCUSATE SODIUM 100 MG CAP
100 mg | Freq: Two times a day (BID) | ORAL | Status: DC
Start: 2012-09-15 — End: 2012-09-16
  Administered 2012-09-15 – 2012-09-16 (×3): via ORAL

## 2012-09-15 MED ORDER — ZOLPIDEM 5 MG TAB
5 mg | Freq: Every evening | ORAL | Status: DC | PRN
Start: 2012-09-15 — End: 2012-09-16
  Administered 2012-09-16: 05:00:00 via ORAL

## 2012-09-15 MED ORDER — LIDOCAINE (PF) 20 MG/ML (2 %) IJ SOLN
20 mg/mL (2 %) | INTRAMUSCULAR | Status: DC | PRN
Start: 2012-09-15 — End: 2012-09-15
  Administered 2012-09-15: 12:00:00 via INTRAVENOUS

## 2012-09-15 MED ORDER — DIPHENHYDRAMINE HCL 50 MG/ML IJ SOLN
50 mg/mL | INTRAMUSCULAR | Status: DC | PRN
Start: 2012-09-15 — End: 2012-09-15

## 2012-09-15 MED ORDER — CEFAZOLIN 1 GRAM SOLUTION FOR INJECTION
1 gram | INTRAMUSCULAR | Status: AC
Start: 2012-09-15 — End: ?

## 2012-09-15 MED ORDER — FENTANYL CITRATE (PF) 50 MCG/ML IJ SOLN
50 mcg/mL | Freq: Once | INTRAMUSCULAR | Status: DC
Start: 2012-09-15 — End: 2012-09-15
  Administered 2012-09-15: 11:00:00 via INTRAVENOUS

## 2012-09-15 MED ORDER — FAMOTIDINE (PF) 20 MG/2 ML IV
20 mg/2 mL | Freq: Once | INTRAVENOUS | Status: DC
Start: 2012-09-15 — End: 2012-09-15
  Administered 2012-09-15: 11:00:00 via INTRAVENOUS

## 2012-09-15 MED ORDER — ONDANSETRON (PF) 4 MG/2 ML INJECTION
4 mg/2 mL | INTRAMUSCULAR | Status: DC | PRN
Start: 2012-09-15 — End: 2012-09-16

## 2012-09-15 MED ORDER — ONDANSETRON (PF) 4 MG/2 ML INJECTION
4 mg/2 mL | Freq: Once | INTRAMUSCULAR | Status: DC
Start: 2012-09-15 — End: 2012-09-15
  Administered 2012-09-15: 15:00:00 via INTRAVENOUS

## 2012-09-15 MED ORDER — CEFAZOLIN 1 GRAM SOLUTION FOR INJECTION
1 gram | INTRAMUSCULAR | Status: DC | PRN
Start: 2012-09-15 — End: 2012-09-15
  Administered 2012-09-15: 13:00:00

## 2012-09-15 MED ORDER — CEFAZOLIN 2 GRAM/50 ML NS IVPB
INTRAVENOUS | Status: AC
Start: 2012-09-15 — End: 2012-09-15
  Administered 2012-09-15: 12:00:00 via INTRAVENOUS

## 2012-09-15 MED ORDER — LACTATED RINGERS IV
INTRAVENOUS | Status: DC
Start: 2012-09-15 — End: 2012-09-15
  Administered 2012-09-15 (×3): via INTRAVENOUS

## 2012-09-15 MED ORDER — OXYCODONE-ACETAMINOPHEN 5 MG-325 MG TAB
5-325 mg | ORAL | Status: DC | PRN
Start: 2012-09-15 — End: 2012-09-15

## 2012-09-15 MED ORDER — PROPOFOL 10 MG/ML IV EMUL
10 mg/mL | INTRAVENOUS | Status: DC | PRN
Start: 2012-09-15 — End: 2012-09-15
  Administered 2012-09-15: 12:00:00 via INTRAVENOUS

## 2012-09-15 MED ORDER — SODIUM CHLORIDE 0.9 % IJ SYRG
INTRAMUSCULAR | Status: DC | PRN
Start: 2012-09-15 — End: 2012-09-15

## 2012-09-15 MED ORDER — ONDANSETRON (PF) 4 MG/2 ML INJECTION
4 mg/2 mL | INTRAMUSCULAR | Status: DC | PRN
Start: 2012-09-15 — End: 2012-09-15
  Administered 2012-09-15: 13:00:00 via INTRAVENOUS

## 2012-09-15 MED ORDER — PROMETHAZINE 25 MG/ML INJECTION
25 mg/mL | Freq: Four times a day (QID) | INTRAMUSCULAR | Status: DC | PRN
Start: 2012-09-15 — End: 2012-09-16

## 2012-09-15 MED ORDER — SODIUM CHLORIDE 0.9 % IJ SYRG
INTRAMUSCULAR | Status: DC | PRN
Start: 2012-09-15 — End: 2012-09-16

## 2012-09-15 MED ORDER — ACETAMINOPHEN 1,000 MG/100 ML (10 MG/ML) IV
1000 mg/100 mL (10 mg/mL) | INTRAVENOUS | Status: DC | PRN
Start: 2012-09-15 — End: 2012-09-15
  Administered 2012-09-15: 13:00:00 via INTRAVENOUS

## 2012-09-15 MED ORDER — LIDOCAINE-PRILOCAINE 2.5 %-2.5 % TOPICAL CREAM
Freq: Once | CUTANEOUS | Status: DC
Start: 2012-09-15 — End: 2012-09-15
  Administered 2012-09-15: 11:00:00 via TOPICAL

## 2012-09-15 MED ORDER — THROMBIN (BOVINE) 5,000 UNIT TOPICAL SOLUTION
5000 unit | CUTANEOUS | Status: AC
Start: 2012-09-15 — End: ?

## 2012-09-15 MED ORDER — OXYCODONE-ACETAMINOPHEN 7.5 MG-325 MG TAB
ORAL | Status: DC | PRN
Start: 2012-09-15 — End: 2012-09-16
  Administered 2012-09-15 – 2012-09-16 (×4): via ORAL

## 2012-09-15 MED ORDER — SODIUM CHLORIDE 0.9 % IJ SYRG
Freq: Three times a day (TID) | INTRAMUSCULAR | Status: DC
Start: 2012-09-15 — End: 2012-09-15

## 2012-09-15 MED ORDER — PROMETHAZINE 25 MG/ML INJECTION
25 mg/mL | Freq: Once | INTRAMUSCULAR | Status: DC
Start: 2012-09-15 — End: 2012-09-15
  Administered 2012-09-15: 15:00:00 via INTRAVENOUS

## 2012-09-15 MED ORDER — HYDROMORPHONE 2 MG/ML INJECTION SOLUTION
2 mg/mL | INTRAMUSCULAR | Status: DC | PRN
Start: 2012-09-15 — End: 2012-09-15
  Administered 2012-09-15: 15:00:00 via INTRAVENOUS

## 2012-09-15 MED ORDER — MIDAZOLAM 1 MG/ML IJ SOLN
1 mg/mL | Freq: Once | INTRAMUSCULAR | Status: AC
Start: 2012-09-15 — End: 2012-09-15
  Administered 2012-09-15: 12:00:00 via INTRAVENOUS

## 2012-09-15 MED ORDER — FAMOTIDINE 20 MG TAB
20 mg | Freq: Once | ORAL | Status: AC
Start: 2012-09-15 — End: 2012-09-15
  Administered 2012-09-15: 11:00:00 via ORAL

## 2012-09-15 MED ORDER — ROCURONIUM 10 MG/ML IV
10 mg/mL | INTRAVENOUS | Status: DC | PRN
Start: 2012-09-15 — End: 2012-09-15
  Administered 2012-09-15: 12:00:00 via INTRAVENOUS

## 2012-09-15 MED ORDER — DEXAMETHASONE SODIUM PHOSPHATE 4 MG/ML IJ SOLN
4 mg/mL | INTRAMUSCULAR | Status: DC | PRN
Start: 2012-09-15 — End: 2012-09-15

## 2012-09-15 MED ORDER — THROMBIN (BOVINE) 5,000 UNIT TOPICAL SOLUTION
5000 unit | CUTANEOUS | Status: DC | PRN
Start: 2012-09-15 — End: 2012-09-15
  Administered 2012-09-15: 13:00:00 via TOPICAL

## 2012-09-15 MED ORDER — ACETAMINOPHEN 1,000 MG/100 ML (10 MG/ML) IV
1000 mg/100 mL (10 mg/mL) | Freq: Four times a day (QID) | INTRAVENOUS | Status: DC | PRN
Start: 2012-09-15 — End: 2012-09-15

## 2012-09-15 MED ORDER — LIDOCAINE-EPINEPHRINE 1 %-1:100,000 IJ SOLN
1 %-:00,000 | INTRAMUSCULAR | Status: DC | PRN
Start: 2012-09-15 — End: 2012-09-15
  Administered 2012-09-15: 13:00:00 via INTRADERMAL

## 2012-09-15 MED ORDER — HYDROMORPHONE 2 MG/ML INJECTION SOLUTION
2 mg/mL | INTRAMUSCULAR | Status: AC
Start: 2012-09-15 — End: 2012-09-15
  Administered 2012-09-15: 14:00:00 via INTRAVENOUS

## 2012-09-15 MED ORDER — SODIUM CHLORIDE 0.9 % IJ SYRG
Freq: Three times a day (TID) | INTRAMUSCULAR | Status: DC
Start: 2012-09-15 — End: 2012-09-16
  Administered 2012-09-15 (×2): via INTRAVENOUS

## 2012-09-15 MED ORDER — ACETAMINOPHEN 325 MG TABLET
325 mg | ORAL | Status: DC | PRN
Start: 2012-09-15 — End: 2012-09-16

## 2012-09-15 MED ORDER — LIDOCAINE HCL 1 % (10 MG/ML) IJ SOLN
10 mg/mL (1 %) | INTRAMUSCULAR | Status: DC | PRN
Start: 2012-09-15 — End: 2012-09-15

## 2012-09-15 MED ORDER — DEXAMETHASONE SODIUM PHOSPHATE 10 MG/ML IJ SOLN
10 mg/mL | INTRAMUSCULAR | Status: AC
Start: 2012-09-15 — End: 2012-09-15
  Administered 2012-09-15: 11:00:00 via INTRAVENOUS

## 2012-09-15 MED ORDER — HYDROCODONE-ACETAMINOPHEN 5 MG-325 MG TAB
5-325 mg | ORAL | Status: DC | PRN
Start: 2012-09-15 — End: 2012-09-15

## 2012-09-15 MED ORDER — NORETHINDRONE 0.4 MG-ETHINYL ESTRADIOL 35 MCG TABLET
Freq: Every day | ORAL | Status: DC
Start: 2012-09-15 — End: 2012-09-16
  Administered 2012-09-15 – 2012-09-16 (×2): via ORAL

## 2012-09-15 MED ORDER — GLYCOPYRROLATE 0.2 MG/ML IJ SOLN
0.2 mg/mL | INTRAMUSCULAR | Status: DC | PRN
Start: 2012-09-15 — End: 2012-09-15
  Administered 2012-09-15: 14:00:00 via INTRAMUSCULAR

## 2012-09-15 MED ORDER — NEOSTIGMINE METHYLSULFATE 1 MG/ML INJECTION
1 mg/mL | INTRAMUSCULAR | Status: DC | PRN
Start: 2012-09-15 — End: 2012-09-15
  Administered 2012-09-15: 14:00:00 via INTRAVENOUS

## 2012-09-15 MED FILL — NEOSTIGMINE METHYLSULFATE 1 MG/ML INJECTION: 1 mg/mL | INTRAMUSCULAR | Qty: 4

## 2012-09-15 MED FILL — FAMOTIDINE 20 MG TAB: 20 mg | ORAL | Qty: 1

## 2012-09-15 MED FILL — THROMBIN-JMI 5,000 UNIT TOPICAL SOLUTION: 5000 unit | CUTANEOUS | Qty: 2

## 2012-09-15 MED FILL — ROCURONIUM 10 MG/ML IV: 10 mg/mL | INTRAVENOUS | Qty: 50

## 2012-09-15 MED FILL — HYDROMORPHONE 2 MG/ML INJECTION SOLUTION: 2 mg/mL | INTRAMUSCULAR | Qty: 1

## 2012-09-15 MED FILL — CEFAZOLIN 2 GRAM/50 ML NS IVPB: INTRAVENOUS | Qty: 50

## 2012-09-15 MED FILL — ONDANSETRON (PF) 4 MG/2 ML INJECTION: 4 mg/2 mL | INTRAMUSCULAR | Qty: 2

## 2012-09-15 MED FILL — CEFAZOLIN 1 GRAM SOLUTION FOR INJECTION: 1 gram | INTRAMUSCULAR | Qty: 1000

## 2012-09-15 MED FILL — LACTATED RINGERS IV: INTRAVENOUS | Qty: 1000

## 2012-09-15 MED FILL — LIDOCAINE-PRILOCAINE 2.5 %-2.5 % TOPICAL CREAM: CUTANEOUS | Qty: 5

## 2012-09-15 MED FILL — OXYCODONE-ACETAMINOPHEN 7.5 MG-325 MG TAB: ORAL | Qty: 1

## 2012-09-15 MED FILL — FENTANYL CITRATE (PF) 50 MCG/ML IJ SOLN: 50 mcg/mL | INTRAMUSCULAR | Qty: 250

## 2012-09-15 MED FILL — OFIRMEV 1,000 MG/100 ML (10 MG/ML) INTRAVENOUS SOLUTION: 1000 mg/100 mL (10 mg/mL) | INTRAVENOUS | Qty: 100

## 2012-09-15 MED FILL — DEXAMETHASONE SODIUM PHOSPHATE 10 MG/ML IJ SOLN: 10 mg/mL | INTRAMUSCULAR | Qty: 1

## 2012-09-15 MED FILL — LIDOCAINE (PF) 20 MG/ML (2 %) IV SYRINGE: 100 mg/5 mL (2 %) | INTRAVENOUS | Qty: 5

## 2012-09-15 MED FILL — DOCUSATE SODIUM 100 MG CAP: 100 mg | ORAL | Qty: 1

## 2012-09-15 MED FILL — PROPOFOL 10 MG/ML IV EMUL: 10 mg/mL | INTRAVENOUS | Qty: 160

## 2012-09-15 MED FILL — GLYCOPYRROLATE 0.2 MG/ML IJ SOLN: 0.2 mg/mL | INTRAMUSCULAR | Qty: 0.6

## 2012-09-15 MED FILL — LIDOCAINE-EPINEPHRINE 1 %-1:100,000 IJ SOLN: 1 %-:00,000 | INTRAMUSCULAR | Qty: 20

## 2012-09-15 MED FILL — MIDAZOLAM 1 MG/ML IJ SOLN: 1 mg/mL | INTRAMUSCULAR | Qty: 2

## 2012-09-15 NOTE — Progress Notes (Signed)
Dual skin assessment completed with this nurse and Arsenio Katz, RN. Incision to anterior neck, incision well approximated, derma bond primeo intact. No other skin problems noted at this time.

## 2012-09-15 NOTE — Progress Notes (Signed)
NS  POST OP CHECK  DRY DRESSINGS  MOVES LIMBS  WELL  Donia Ast  MD

## 2012-09-15 NOTE — Other (Signed)
TRANSFER - OUT REPORT:    Verbal report given to Danielle RN(name) on Marissa Perry  being transferred to 703(unit) for routine post - op       Report consisted of patient???s Situation, Background, Assessment and   Recommendations(SBAR).     Information from the following report(s) Kardex, OR Summary, Intake/Output and MAR was reviewed with the receiving nurse.    Opportunity for questions and clarification was provided.      VTE prophylaxis orders have been written for Zoe Lan.    Family updated with room number.

## 2012-09-15 NOTE — Progress Notes (Signed)
Problem: Mobility Impaired (Adult and Pediatric)  Goal: *Acute Goals and Plan of Care (Insert Text)  PHYSICAL THERAPY: INITIAL ASSESSMENT, DISCHARGE AND PM  INPATIENT: [facility] : Hospital Day: 1    NAME/AGE/GENDER: Marissa Perry is a 37 y.o. female  DATE: 09/15/2012  PRIMARY DIAGNOSIS: C6-7 HERNIATED NU PUL  HNP (herniated nucleus pulposus), cervical  Procedure(s) (LRB):  C6-7 SPINE ANTERIOR CERVICAL DISCECTOMY FUSION WITH ROIC  DISC/COW (N/A) Day of Surgery     INTERDISCIPLINARY COLLABORATION: Physical Therapist  ASSESSMENT:   Ms. Tep is s/p above procedure. Reviewed spinal precautions and log rolling technique. Also reviewed shoulder circles and gentle neck ROM that patient can do at home. Patient is currently functioning at baseline with no mobility deficits noted. Ambulated 250 feet independently. Demonstrates good knowledge of education. Will discharge from therapy at this time.         SUBJECTIVE:   "Iam groggy"    Present Symptoms:    Pain Intensity 1: 3  Pain Location 1: Neck;Incisional  Pain Orientation 1: Anterior;Posterior  Pain Intervention(s) 1: Medication (see MAR);Rest;Relaxation technique  History of Present Injury/Illness: s/p above procedure  Prior Level of Function/Home Situation: Pt lives with husband. Was independent with ADLs and ambulating without a device prior to admission. Works in event planning   Home Environment: Private residence  # Steps to Enter: 4  One/Two Story Residence: One story  Living Alone: No  Support Systems: Family member(s);Spouse  Patient Expects to be Discharged to:: Private residence  Current DME Used/Available at Home: None  OBJECTIVE/TREATMENT:   (In addition to Assessment/Re-Assessment sessions the following treatments were rendered)                                      [facility]??? ???6 Clicks???                                          Basic Mobility Inpatient Short Form  How much difficulty does the patient currently have... Unable A Lot A Little  None   1.  Turning over in bed (including adjusting bedclothes, sheets and blankets)?   [ ]  1   [ ]  2   [ ]  3   [X]  4   2.  Sitting down on and standing up from a chair with arms ( e.g., wheelchair, bedside commode, etc.)   [ ]  1   [ ]  2   [ ]  3   [X]  4   3.  Moving from lying on back to sitting on the side of the bed?   [ ]  1   [ ]  2   [ ]  3   [X]  4               How much help from another person does the patient currently need... Total A Lot A Little None   4.  Moving to and from a bed to a chair (including a wheelchair)?   [ ]  1   [ ]  2   [ ]  3   [X]  4   5.  Need to walk in hospital room?   [ ]  1   [ ]  2   [ ]  3   [X]  4   6.  Climbing 3-5 steps with a railing?   [ ]   1   [ ]  2   [ ]  3   [X]  4   ?? 2007, Trustees of [address], under license to [place], [place]. All rights reserved       Score:  Initial: 24 Most Recent: X (Date: -- )   Interpretation of Tool:  Represents activities that are increasingly more difficult (i.e. Bed mobility, Transfers, Gait).  Score 24 23 22-20 19-15 14-10 9-7 6   Modifier CH CI CJ CK CL CM CN       ?? Mobility - Walking and Moving Around:              [phone] - CURRENT STATUS:        CH - 0% impaired, limited or restricted              [id number] - GOAL STATUS:                CH - 0% impaired, limited or restricted              [id number] - D/C STATUS:                    CH - 0% impaired, limited or restricted  Payor: [employer]  Plan: [place] [employer] H[place]  Product Type: HMO     Most Recent Physical Functioning:   Gross Assessment:  AROM: Within functional limits  Strength: Within functional limits  Coordination: Within functional limits  Sensation: Intact  Posture:  Posture (WDL): Exceptions to WDL  Posture Assessment: Forward head  Balance:  Sitting: Intact  Standing: Intact  Bed Mobility:  Rolling: Modified independence, requires equipment  Supine to Sit: Modified independence, requires equipment  Sit to Supine: Modified independence, requires equipment  Wheelchair Mobility:     Transfers:   Sit to Stand: Independent  Stand to Sit: Independent  Bed to Chair: Independent  Gait:            Assessment/Reassessment only, no treatment provided today    Braces/Orthotics/Lines/Etc:   ?? IV  ?? O2 Device: Room air  Safety:   After treatment position/precautions:  ?? Supine in bed  ?? Bed/Chair-wheels locked  ?? Call light within reach  ?? RN notified  ?? Family at bedside    Total Treatment Duration:  Time In: 1430  Time Out: 1445  Lanice Shirts. Maurine Minister, DPT

## 2012-09-15 NOTE — Progress Notes (Signed)
Chaplain- Visited as requested, to offer spiritual support and prayer prior to surgery. (Pt goes by "Marissa Perry.")   Pt's husband, Marissa Perry, was present.      Glenda Chroman, MDiv, [facility]  Chaplain

## 2012-09-15 NOTE — Progress Notes (Signed)
TRANSFER - IN REPORT:    Verbal report received from Clydie Braun, [place] on Marissa Perry  being received from PACU for routine progression of care      Report consisted of patient???s Situation, Background, Assessment and   Recommendations(SBAR).     Information from the following report(s) SBAR, Kardex, OR Summary, Procedure Summary, Intake/Output, MAR and Recent Results was reviewed with the receiving nurse.    Opportunity for questions and clarification was provided.      Assessment completed upon patient???s arrival to unit and care assumed.

## 2012-09-15 NOTE — Brief Op Note (Signed)
BRIEF OPERATIVE NOTE    Date of Procedure: 09/15/2012   Preoperative Diagnosis: C6-7 HERNIATED NUCLEUS PULPOSUS  Postoperative Diagnosis: C6-7 HERNIATED NUCLEUS PULPOSUS    Procedure(s):  C6-7 SPINE ANTERIOR CERVICAL DISCECTOMY FUSION WITH ROIC  DISC/COW  Surgeon(s) and Role:     * Harvie Bridge, MD - Primary  Anesthesia: General   Estimated Blood Loss: minimal  Specimens:   ID Type Source Tests Collected by Time Destination   1 : Cervical 6-7 Disc Material Routine Disc Material  Harvie Bridge, MD 09/15/2012 0803 Pathology      Findings: hnp   Complications: none  Implants:   Implant Name Type Inv. Item Serial No. Manufacturer Lot No. LRB No. Used Action   GRAFT BNE VITOSS FOAM 1.2ML --  - [id number]  [id number]  [employer] [id number] N/A 1 Implanted   CAGE ANT CERV ROIC 12X14MM --  - [id number]  [id number]  [employer] [id number] N/A 1 Implanted   PLT ANT [facility] ROIC STD --  - [id number]   [id number]   [employer] [id number] N/A 1 Implanted

## 2012-09-15 NOTE — Anesthesia Pre-Procedure Evaluation (Signed)
Anesthetic History   No history of anesthetic complications           Review of Systems / Medical History  Patient summary reviewed, nursing notes reviewed and pertinent labs reviewed    Pulmonary  Within defined limits               Neuro/Psych             Comments: Herniated nucleus propulsus Cardiovascular                Exercise tolerance: >4 METS     GI/Hepatic/Renal  Within defined limits                Endo/Other             Other Findings            Physical Exam    Airway  Mallampati: I  TM Distance: 4 - 6 cm  Neck ROM: decreased range of motion   Mouth opening: Normal     Cardiovascular    Rhythm: regular  Rate: normal         Dental         Pulmonary  Breath sounds clear to auscultation               Abdominal  GI exam deferred       Other Findings            Anesthetic Plan    ASA: 2  Anesthesia type: general          Induction: Intravenous  Anesthetic plan and risks discussed with: Patient

## 2012-09-15 NOTE — Other (Signed)
Patient's family unavailable for surgical update. Update given to waiting room attendant and will be relayed to family upon their return. A.Thompson,RN @ 0750.

## 2012-09-15 NOTE — Anesthesia Post-Procedure Evaluation (Signed)
Post-Anesthesia Evaluation and Assessment    Patient: Marissa Perry MRN: [id number]  SSN: [id number]    Date of Birth: [birth year]  Age: 37 y.o.  Sex: female       Cardiovascular Function/Vital Signs  Visit Vitals   Item Reading   ??? BP 111/59   ??? Pulse 71   ??? Temp 36.4 ??C (97.6 ??F)   ??? Resp 16   ??? Ht 5\' 3"  (1.6 m)   ??? Wt 84.369 kg (186 lb)   ??? BMI 32.96 kg/m2   ??? SpO2 95%       Patient is status post General anesthesia for Procedure(s):  C6-7 SPINE ANTERIOR CERVICAL DISCECTOMY FUSION WITH ROIC  DISC/COW.    Nausea/Vomiting: None    Postoperative hydration reviewed and adequate.    Pain:  Pain Scale 1: Numeric (0 - 10) (09/15/12 0950)  Pain Intensity 1: 6 (09/15/12 0950)   Managed    Neurological Status:   Neuro (WDL): Within Defined Limits (09/15/12 0950)  Neuro  Neurologic State: Drowsy (09/15/12 7829)   At baseline    Mental Status and Level of Consciousness: Alert and oriented     Pulmonary Status:   O2 Device: Nasal cannula (09/15/12 0950)   Adequate oxygenation and airway patent    Complications related to anesthesia: None    Post-anesthesia assessment completed. No concerns    Signed By: Rosalee Kaufman, MD     September 15, 2012

## 2012-09-16 MED ORDER — OXYCODONE-ACETAMINOPHEN 7.5 MG-325 MG TAB
ORAL_TABLET | Freq: Four times a day (QID) | ORAL | Status: DC | PRN
Start: 2012-09-16 — End: 2014-03-18

## 2012-09-16 MED FILL — ZOLPIDEM 5 MG TAB: 5 mg | ORAL | Qty: 1

## 2012-09-16 MED FILL — OXYCODONE-ACETAMINOPHEN 7.5 MG-325 MG TAB: ORAL | Qty: 1

## 2012-09-16 MED FILL — DOCUSATE SODIUM 100 MG CAP: 100 mg | ORAL | Qty: 1

## 2012-09-16 MED FILL — MORPHINE 2 MG/ML INJECTION: 2 mg/mL | INTRAMUSCULAR | Qty: 1

## 2012-09-16 MED FILL — CEFAZOLIN 2 GRAM/50 ML NS IVPB: INTRAVENOUS | Qty: 50

## 2012-09-16 NOTE — Progress Notes (Signed)
Problem: Self Care Deficits Care Plan (Adult)  Goal: *Acute Goals and Plan of Care (Insert Text)  OCCUPATIONAL THERAPY: INITIAL ASSESSMENT AND DISCHARGE  INPATIENT: [facility] : Hospital Day: 2    NAME/AGE/GENDER: Marissa Perry is a 37 y.o. female  DATE: 09/16/2012  PRIMARY DIAGNOSIS: C6-7 HERNIATED NU PUL  HNP (herniated nucleus pulposus), cervical  Procedure(s) (LRB):  C6-7 SPINE ANTERIOR CERVICAL DISCECTOMY FUSION WITH ROIC  DISC/COW (N/A) 1 Day Post-Op     INTERDISCIPLINARY COLLABORATION: Occupational Therapist and Registered Nurse  ASSESSMENT:   Ms. Koenen status post above procedure. Patient reports soreness 2/10 in neck area. BUE AROM is WFL and strength is 5/5 grossly except 4+/5 in R biceps. Sensation, coordination, and balance are intact. Patient is functioning at baseline and will have the assistance of her husband after discharge. Reviewed spinal precautions and patient verbalized understanding. Therefore, no acute OT needs at this time. Will discharge OT. Patient in agreement.     RECOMMENDED REHABILITATION/EQUIPMENT: (at time of discharge pending progress):   None.  SUBJECTIVE:   "They are trying to find me something else to do at work."    History of Present Injury/Illness: status post above procedure  Present Symptoms:  Soreness in neck    Pain Intensity 1: 2  Pain Location 1: Throat  Pain Orientation 1: Anterior;Posterior  Pain Intervention(s) 1: Medication (see MAR)  Prior Level of Function/Home Situation: Patient lives with her husband and 58 year old child. She is normally independent but did report some stiffness and pain in her R shoulder and neck prior to surgery. 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    Home Environment: Private residence  # Steps to Enter: 4  One/Two Story Residence: One story  Living Alone: No  Support Systems: Family member(s);Spouse  Patient Expects to be Discharged to:: Private residence  Current DME Used/Available at Home:  Cane, straight  Tub or Shower Type: Shower  OBJECTIVE/TREATMENT:   (In addition to Assessment/Re-Assessment sessions the following treatments were rendered)                                                  [employer] AM-PACTM "6 Clicks"                                                       Daily Activity Inpatient Short Form  How much help from another person does the patient currently need... Total A Lot A Little None   1.  Putting on and taking off regular lower body clothing?   [ ]  1   [ ]  2   [ ]  3   [X]  4   2.  Bathing (including washing, rinsing, drying)?   [ ]  1   [ ]  2   [ ]  3   [X]  4   3.  Toileting, which includes using toilet, bedpan or urinal?   [ ]  1   [ ]  2   [ ]  3   [X]  4   4.  Putting on and taking off regular upper body clothing?   [ ]  1   [ ]  2   [ ]   3   [X]  4   5.  Taking care of personal grooming such as brushing teeth?   [ ]  1   [ ]  2   [ ]  3   [X]  4   6.  Eating meals?   [ ]  1   [ ]  2   [ ]  3   [X]  4   ?? 2007, Trustees of [address], under license to [employer] [place]. All rights reserved       Score:  Initial: 24 Most Recent: X (Date: -- )   Interpretation of Tool:  Represents clinically-significant functional categories (i.e.Activities of daily living).  Score 24 23 22-20 19-14 13-9 8-7 6   Modifier CH CI CJ CK CL CM CN       ?? Self Care:              [id number] - CURRENT STATUS:            CH - 0% impaired, limited or restricted              [phone] - GOAL STATUS:                    CH - 0% impaired, limited or restricted              [id number] - D/C STATUS:                       CH - 0% impaired, limited or restricted  Payor: [facility]  Plan: [place] [facility] H[place]  Product Type: HMO       evaluation only    Balance  Sitting: Intact  Standing: Intact       Patient Vitals for the past 6 hrs:    BP BP Patient Position SpO2 Pulse   09/16/12 0341 116/58 mmHg Sitting 98 % 71   09/16/12 0713 111/64 mmHg Sitting 98 % 64       Gross Assessment: Yes  Gross Assessment  AROM: Within functional limits  (BUE)  Strength: Within functional limits (BUE 5/5 except R biceps 4+/5)  Coordination: Within functional limits  Sensation: Intact (BUE)                                       Mental Status  Neurologic State: Alert  Orientation Level: Appropriate for age;Oriented X4  Cognition: Appropriate decision making;Appropriate safety awareness;Follows commands  Perception: Appears intact  Perseveration: No perseveration noted  Safety/Judgement: Awareness of environment;Fall prevention      Basic ADLs (From Assessment) Complex ADLs (From Assessment)   Basic ADL  Feeding: Independent  Oral Facial Hygiene/Grooming: Independent  Bathing: Independent  Upper Body Dressing: Independent  Lower Body Dressing: Independent  Toileting: Independent Instrumental ADL  Meal Preparation: Moderate assistance  Homemaking: Maximum assistance  Medication Management: Independent  Financial Management: Independent   Grooming/Bathing/Dressing Activities of Daily Living     Cognitive Retraining  Safety/Judgement: Awareness of environment;Fall prevention                       Bed/Mat Mobility  Supine to Sit: Modified independence, requires equipment  Sit to Stand: Independent          Braces/Orthotics/Lines/Other:   ?? Hand Dominance: right handed  ?? IV  ?? O2 Device: Room air    Safety:   After treatment position/precautions:  ??  Bed/Chair-wheels locked  ?? Bed in low position  ?? Call light within reach  ?? Family at bedside  ?? sitting edge of bed      Total Treatment Duration:  Time In: 0835  Time Out: 0850  Harriet Masson, OT

## 2012-09-16 NOTE — Progress Notes (Signed)
NS  POD#1  AFEBRILE  DRY DRESSINGS  5/5 POWER  A/P HOME TODAY  M Arliss Hepburn MD

## 2012-09-16 NOTE — Op Note (Signed)
[facility]                            [address]                           [place], [place]. 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    NAME OF PROCEDURE  1. Anterior cervical spine total diskectomy with decompression of the  spinal cord and nerve roots, C6-7.  2. Anterior cervical spine interbody fusion with LDR PEEK cage, Vitoss  and bone marrow aspirate, C6-7.  3. Anterior cervical spine instrumentation with LDR plates, E9-5.  4. Bone marrow aspiration, C6 and C7 vertebra.    SURGEON: Theotis Barrio. Kirstie Peri, MD    ANESTHESIA: General endotracheal.    ESTIMATED BLOOD LOSS: Minimal.    PREPARATION: ChloraPrep.    COMPLICATIONS: None.    HISTORY OF PRESENT ILLNESS: A 37 year old lady with intractable right arm  pain and weakness refractory to conservative measures. MRI scanning was  positive for disk herniation, stenosis and right-sided compression at  C6-7 and the patient was admitted for surgery as conservative measures  have failed.    OPERATIVE NOTE: The patient was brought to the operating room, carefully  placed under general endotracheal anesthesia without complications,  placed supine with a roll under the shoulder blades and the head gently  extended on a Richards pillow. The right side of the neck was carefully  prepped with Betadine in the usual sterile fashion.    A transverse skin incision was drawn out along the right side at the   level of cricothyroid cartilage. This was then infiltrated with 1%  Xylocaine with epinephrine and incised with a 15 blade sharply through  the skin and platysma. Hemostasis achieved using bipolar cautery. Using  rostral to caudal subplatysmal dissection, a plane was created between  the carotid artery laterally and trachea and esophagus medially.  Hand-held retractors were placed. The longus colli muscles were dissected  bilaterally. Lateral C-arm fluoroscopy confirmed an 18-gauge spinal  needle correctly inserted into C6-7. The spinal needle was removed. The  disk space was opened with a 15 blade and cleaned with pituitary  rongeurs. Caspar distracting pins were placed and the disk space was  widened and distracted. Using the Medtronic drill, the endplates were  drilled down to the posterior ligament. The ligament was opened with an  arachnoid knife and a 1 mm Kerrison rongeur, decompressing both neural  elements bilaterally. A ball tip probe was passed easily along the dura  bilaterally. A small amount of epidural bleeding was present and was  easily controlled with Gelfoam.    At this point, the pins were removed and the holes were aspirated with a  3 mL syringe and 14 gauge Angiocath and mixed with Vitoss on the back  table. The holes were plugged with Nu-Knit Surgicel. A number 6 LDR trial  was tightly and successfully placed in the disk space and an LDR PEEK  cage number 6 pack with Vitoss and bone marrow aspirate was countersunk  to a nice, tight fit. LDR plates were engaged first inferiorly and then  superiorly with 1 and 2 mm taps and a mallet.    At this point, the retractors were removed and the wound was flooded with  thrombin for 3 minutes and during this time, AP and lateral x-ray were  obtained which confirmed good position of the graft and hardware. This  thrombin was removed. Additional thrombin was placed for 2 minutes. This  was left after 2 minutes. No bleeding was noted. It was dry. Surgiflo  was  placed for additional prophylaxis and the wound was closed. The platysma  was closed tightly with interrupted 3-0 Vicryl. Subcutaneous tissues were  closed with interrupted 3-0 Vicryl. Skin was closed with Prineo tape and  Dermabond.    The patient tolerated the procedure well, was awakened, extubated, and  taken to PACU in stable condition. There were no apparent complications.                Harvie Bridge, MD    A                This is an unverified document unless signed by physician.    TID:  [id number]                                      DT:  09/16/2012 10:01 A  JOB:  [id number]           DOC#:  [id number]           DD:  09/15/2012    cc:   Harvie Bridge, MD

## 2012-09-16 NOTE — Progress Notes (Signed)
Discharge instructions given to patient. Instructions included follow-up info, medication list and information, s/s of infection, and when to call MD/911. Opportunity provided for questions. Pt verbalized understanding of all instructions.

## 2012-09-16 NOTE — Op Note (Signed)
[facility]                            [address]                           [place], [place]. 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    NAME OF PROCEDURE  1. Anterior cervical spine total diskectomy with decompression of the  spinal cord and nerve roots, C6-7.  2. Anterior cervical spine interbody fusion with LDR PEEK cage, Vitoss  and bone marrow aspirate, C6-7.  3. Anterior cervical spine instrumentation with LDR plates, Y8-6.  4. Bone marrow aspiration, C6 and C7 vertebra.    SURGEON: Theotis Barrio. Kirstie Peri, MD    ANESTHESIA: General endotracheal.    ESTIMATED BLOOD LOSS: Minimal.    PREPARATION: ChloraPrep.    COMPLICATIONS: None.    HISTORY OF PRESENT ILLNESS: A 37 year old lady with intractable right arm  pain and weakness refractory to conservative measures. MRI scanning was  positive for disk herniation, stenosis and right-sided compression at  C6-7 and the patient was admitted for surgery as conservative measures  have failed.    OPERATIVE NOTE: The patient was brought to the operating room, carefully  placed under general endotracheal anesthesia without complications,  placed supine with a roll under the shoulder blades and the head gently  extended on a Richards pillow. The right side of the neck was carefully  prepped with Betadine in the usual sterile fashion.    A transverse skin incision was drawn out along the right side at  the  level of cricothyroid cartilage. This was then infiltrated with 1%  Xylocaine with epinephrine and incised with a 15 blade sharply through  the skin and platysma. Hemostasis achieved using bipolar cautery. Using  rostral to caudal subplatysmal dissection, a plane was created between  the carotid artery laterally and trachea and esophagus medially.  Hand-held retractors were placed. The longus colli muscles were dissected  bilaterally. Lateral C-arm fluoroscopy confirmed an 18-gauge spinal  needle correctly inserted into C6-7. The spinal needle was removed. The  disk space was opened with a 15 blade and cleaned with pituitary  rongeurs. Caspar distracting pins were placed and the disk space was  widened and distracted. Using the Medtronic drill, the endplates were  drilled down to the posterior ligament. The ligament was opened with an  arachnoid knife and a 1 mm Kerrison rongeur, decompressing both neural  elements bilaterally. A ball tip probe was passed easily along the dura  bilaterally. A small amount of epidural bleeding was present and was  easily controlled with Gelfoam.    At this point, the pins were removed and the holes were aspirated with a  3 mL syringe and 14 gauge Angiocath and mixed with Vitoss on the back  table. The holes were plugged with Nu-Knit Surgicel. A number 6 LDR trial  was tightly and successfully placed in the disk space and an LDR PEEK  cage number 6 pack with Vitoss and bone marrow aspirate was countersunk  to a nice, tight fit. LDR plates were engaged first inferiorly and then  superiorly with 1 and 2 mm taps and a mallet.    At this point, the retractors were removed and the wound was flooded with  thrombin for 3 minutes and during this time, AP and lateral x-ray were  obtained which confirmed good position of the graft and hardware. This  thrombin was removed. Additional thrombin was placed for 2 minutes. This  was left after 2 minutes. No bleeding was noted. It was dry.  Surgiflo was  placed for additional prophylaxis and the wound was closed. The platysma  was closed tightly with interrupted 3-0 Vicryl. Subcutaneous tissues were  closed with interrupted 3-0 Vicryl. Skin was closed with Prineo tape and  Dermabond.    The patient tolerated the procedure well, was awakened, extubated, and  taken to PACU in stable condition. There were no apparent complications.                Harvie Bridge, MD    A                This is an unverified document unless signed by physician.    TID:  [id number]                                      DT:  09/16/2012 10:01 A  JOB:  [id number]           DOC#:  [id number]           DD:  09/15/2012    cc:   Harvie Bridge, MD

## 2012-10-03 NOTE — Addendum Note (Signed)
Addendum created 10/03/12 1548 by Herschel Senegal, CRNA    Modules edited: Anesthesia Events

## 2012-10-21 ENCOUNTER — Encounter

## 2013-04-20 ENCOUNTER — Emergency Department (HOSPITAL_COMMUNITY): Payer: Self-pay

## 2013-04-20 ENCOUNTER — Emergency Department (HOSPITAL_COMMUNITY)
Admission: EM | Admit: 2013-04-20 | Discharge: 2013-04-20 | Disposition: A | Discharge status: Home / Self Care | Payer: Self-pay | Attending: Emergency Medicine | Admitting: Emergency Medicine

## 2013-04-20 ENCOUNTER — Encounter (HOSPITAL_COMMUNITY): Payer: Self-pay

## 2013-04-20 DIAGNOSIS — Z79899 Other long term (current) drug therapy: Secondary | ICD-10-CM | POA: Insufficient documentation

## 2013-04-20 DIAGNOSIS — R110 Nausea: Secondary | ICD-10-CM | POA: Insufficient documentation

## 2013-04-20 DIAGNOSIS — Z8739 Personal history of other diseases of the musculoskeletal system and connective tissue: Secondary | ICD-10-CM | POA: Insufficient documentation

## 2013-04-20 DIAGNOSIS — Z9851 Tubal ligation status: Secondary | ICD-10-CM | POA: Insufficient documentation

## 2013-04-20 DIAGNOSIS — Z3202 Encounter for pregnancy test, result negative: Secondary | ICD-10-CM | POA: Insufficient documentation

## 2013-04-20 DIAGNOSIS — Z87448 Personal history of other diseases of urinary system: Secondary | ICD-10-CM | POA: Insufficient documentation

## 2013-04-20 DIAGNOSIS — N200 Calculus of kidney: Secondary | ICD-10-CM | POA: Insufficient documentation

## 2013-04-20 HISTORY — DX: Unspecified osteoarthritis, unspecified site: M19.90

## 2013-04-20 LAB — POCT I-STAT, CHEM 8
Chloride: 108 mEq/L (ref 96–112)
Creatinine, Ser: 0.7 mg/dL (ref 0.50–1.10)
HCT: 40 % (ref 36.0–46.0)
Hemoglobin: 13.6 g/dL (ref 12.0–15.0)
Potassium: 4 mEq/L (ref 3.5–5.1)
Sodium: 140 mEq/L (ref 135–145)

## 2013-04-20 LAB — URINALYSIS W MICROSCOPIC + REFLEX CULTURE
Bilirubin Urine: NEGATIVE
Ketones, ur: NEGATIVE mg/dL
Nitrite: NEGATIVE
Specific Gravity, Urine: 1.026 (ref 1.005–1.030)
Urobilinogen, UA: 1 mg/dL (ref 0.0–1.0)

## 2013-04-20 MED ORDER — OXYCODONE-ACETAMINOPHEN 5-325 MG PO TABS
1.0000 | ORAL_TABLET | Freq: Once | ORAL | Status: AC
  Administered 2013-04-20: 1 via ORAL
  Filled 2013-04-20: qty 1

## 2013-04-20 MED ORDER — ONDANSETRON HCL 4 MG/2ML IJ SOLN
4.0000 mg | Freq: Once | INTRAMUSCULAR | Status: AC
  Administered 2013-04-20: 4 mg via INTRAVENOUS
  Filled 2013-04-20: qty 2

## 2013-04-20 MED ORDER — TAMSULOSIN HCL 0.4 MG PO CAPS: 0.4000 mg | ORAL_CAPSULE | Freq: Every day | ORAL | Status: DC

## 2013-04-20 MED ORDER — OXYCODONE-ACETAMINOPHEN 5-325 MG PO TABS: 1.0000 | ORAL_TABLET | ORAL | Status: DC | PRN

## 2013-04-20 MED ORDER — HYDROMORPHONE HCL PF 1 MG/ML IJ SOLN
0.5000 mg | Freq: Once | INTRAMUSCULAR | Status: AC
  Administered 2013-04-20: 0.5 mg via INTRAVENOUS
  Filled 2013-04-20: qty 1

## 2013-04-20 MED ORDER — IBUPROFEN 800 MG PO TABS: 800.0000 mg | ORAL_TABLET | Freq: Three times a day (TID) | ORAL | Status: DC

## 2013-04-20 MED ORDER — KETOROLAC TROMETHAMINE 30 MG/ML IJ SOLN
30.0000 mg | Freq: Once | INTRAMUSCULAR | Status: AC
  Administered 2013-04-20: 30 mg via INTRAVENOUS
  Filled 2013-04-20: qty 1

## 2013-04-20 MED ORDER — PROMETHAZINE HCL 25 MG PO TABS: 25.0000 mg | ORAL_TABLET | Freq: Four times a day (QID) | ORAL | Status: DC | PRN

## 2013-04-20 NOTE — ED Provider Notes (Signed)
CSN: [id number]     Arrival date & time 04/20/13  0706 History     First MD Initiated Contact with Patient 04/20/13 [phone]     Chief Complaint  Patient presents with  . Left Side Pain   . Nausea   (Consider location/radiation/quality/duration/timing/severity/associated sxs/prior Treatment) HPI Patient is a 37 year old female who presented to emergency department today with complaint of pain in her left flank. Patient states pain woke her up from sleep around 1 AM. Patient states pain is constant, deep, however states it at times gets sharp and severe. He should states pain does radiate down into the left abdomen. Patient denies similar pain in the past. States she has had kidney infections before but this does not feel like her symptoms at that time. She denies any history of kidney stones or any kidney problems. Patient states she did urinate this morning after the pain started in her urine was normal. Patient denies any pain with urination, any urinary frequency or urgency. Patient says she did not take any medications prior to coming in the spleen. Patient states she is having associated nausea however denies any vomiting. Patient states she's not running a fever, denies any respiratory symptoms, denies any abdominal pain, denies any urinary symptoms, vaginal symptoms. Past Medical History  Diagnosis Date  . Arthritis     Left Knee   Past Surgical History  Procedure Laterality Date  . Tubal ligation     History reviewed. No pertinent family history. History  Substance Use Topics  . Smoking status: Never Smoker   . Smokeless tobacco: Not on file  . Alcohol Use: No   OB History   Grav Para Term Preterm Abortions TAB SAB Ect Mult Living                 Review of Systems  Constitutional: Negative for fever and chills.  HENT: Negative for neck pain and neck stiffness.   Respiratory: Negative.   Cardiovascular: Negative.   Gastrointestinal: Positive for nausea and abdominal pain.  Negative for vomiting, diarrhea and constipation.  Genitourinary: Positive for flank pain. Negative for dysuria, vaginal bleeding and vaginal discharge.  Neurological: Negative for dizziness, weakness and headaches.  All other systems reviewed and are negative.    Allergies  Codeine  Home Medications   Current Outpatient Rx  Name  Route  Sig  Dispense  Refill  . ibuprofen (ADVIL,MOTRIN) 200 MG tablet   Oral   Take 400 mg by mouth every 6 (six) hours as needed. Pain         . Multiple Vitamin (MULTIVITAMIN WITH MINERALS) TABS   Oral   Take 1 tablet by mouth daily.          BP 105/45  Pulse 93  Temp(Src) 97.7 F (36.5 C) (Oral)  Resp 18  SpO2 100%  LMP 03/30/2013 Physical Exam  Nursing note and vitals reviewed. Constitutional: She appears well-developed and well-nourished.  The patient appears uncomfortable, she is standing up during the exam, constantly moving.  HENT:  Head: Normocephalic.  Eyes: Conjunctivae are normal.  Neck: Neck supple.  Cardiovascular: Normal rate, regular rhythm and normal heart sounds.   Pulmonary/Chest: Effort normal and breath sounds normal. No respiratory distress. She has no wheezes. She has no rales.  Abdominal: Soft. Bowel sounds are normal. She exhibits no distension. There is no tenderness. There is no rebound.  No CVA tenderness bilaterally  Musculoskeletal: She exhibits no edema.  Neurological: She is alert.  Skin: Skin is warm  and dry.  Psychiatric: She has a normal mood and affect. Her behavior is normal.    ED Course   Procedures (including critical care time)  Results for orders placed during the hospital encounter of 04/20/13  URINALYSIS W MICROSCOPIC + REFLEX CULTURE      Result Value Range   Color, Urine YELLOW  YELLOW   APPearance CLOUDY (*) CLEAR   Specific Gravity, Urine 1.026  1.005 - 1.030   pH 6.5  5.0 - 8.0   Glucose, UA NEGATIVE  NEGATIVE mg/dL   Hgb urine dipstick LARGE (*) NEGATIVE   Bilirubin Urine  NEGATIVE  NEGATIVE   Ketones, ur NEGATIVE  NEGATIVE mg/dL   Protein, ur NEGATIVE  NEGATIVE mg/dL   Urobilinogen, UA 1.0  0.0 - 1.0 mg/dL   Nitrite NEGATIVE  NEGATIVE   Leukocytes, UA TRACE (*) NEGATIVE   WBC, UA 3-6  <3 WBC/hpf   RBC / HPF 21-50  <3 RBC/hpf   Bacteria, UA FEW (*) RARE   Squamous Epithelial / LPF RARE  RARE  POCT I-STAT, CHEM 8      Result Value Range   Sodium 140  135 - 145 mEq/L   Potassium 4.0  3.5 - 5.1 mEq/L   Chloride 108  96 - 112 mEq/L   BUN 16  6 - 23 mg/dL   Creatinine, Ser 4.54  0.50 - 1.10 mg/dL   Glucose, Bld 098 (*) 70 - 99 mg/dL   Calcium, Ion 1.19  1.47 - 1.23 mmol/L   TCO2 22  0 - 100 mmol/L   Hemoglobin 13.6  12.0 - 15.0 g/dL   HCT 82.9  56.2 - 13.0 %  POCT PREGNANCY, URINE      Result Value Range   Preg Test, Ur NEGATIVE  NEGATIVE   Ct Abdomen Pelvis Wo Contrast  04/20/2013   *RADIOLOGY REPORT*  Clinical Data: Left-sided abdominal pain, nausea.  CT ABDOMEN AND PELVIS WITHOUT CONTRAST  Technique:  Multidetector CT imaging of the abdomen and pelvis was performed following the standard protocol without intravenous contrast.  Comparison: None.  Findings: Visualized lung bases appear normal.  No focal abnormalities seen involving the liver, spleen or pancreas.  No gallstones are noted.  Adrenal glands appear normal. Nonobstructive calculus is seen in mid pole collecting system of right kidney. Mild left hydronephrosis is noted secondary to a 4 mm calculus in proximal left ureter.  The appendix appears normal.  No gross bowel abnormality is noted.  Urinary bladder and uterus appear normal. No abnormal fluid collection or significant adenopathy is noted.  IMPRESSION: Small nonobstructive right renal calculus.  Mild left hydronephrosis secondary to a 4 mm proximal left ureteral calculus.   Original Report Authenticated By: Lupita Raider.,  M.D.     1. Kidney stone on left side     MDM  Patient is an emergency department with left flank pain which occur  suddenly and then overnight. The patient's exam and presentation is concerning for a kidney stone. Patient is urine and  CT abdomen obtained. CT showed a 4 mm proximal left ureteral calculus. Patient's pain is controlled in the emergency department with IV Dilaudid, Toradol, Zofran. Patient is currently comfortable pain graded as 1/10. Patient will be discharged home with pain medications, Flomax, and close followup with urology. UA does not appear active at this time we'll send cultures. Renal function. Filed Vitals:   04/20/13 0716 04/20/13 1118  BP: 105/45 121/59  Pulse: 93 60  Temp: 97.7 F (36.5 C)  TempSrc: Oral   Resp: 18 18  SpO2: 100% 100%     Lottie Mussel, PA-C 04/20/13 1517

## 2013-04-20 NOTE — ED Notes (Signed)
Two nurses attempted x2 each all IV start attempts were unsuccessful.

## 2013-04-20 NOTE — ED Notes (Signed)
Pt stated pain started at 0100 this morning (8/18) during sleep. Pt describes as a deep ache and a 7/10.

## 2013-04-20 NOTE — ED Provider Notes (Signed)
Medical screening examination/treatment/procedure(s) were performed by non-physician practitioner and as supervising physician I was immediately available for consultation/collaboration.    Dagmar Hait, MD 04/20/13 [phone]

## 2013-04-21 LAB — URINE CULTURE
Colony Count: NO GROWTH
Culture: NO GROWTH

## 2013-04-22 ENCOUNTER — Emergency Department (HOSPITAL_COMMUNITY)
Admission: EM | Admit: 2013-04-22 | Discharge: 2013-04-22 | Disposition: A | Discharge status: Home / Self Care | Payer: Self-pay | Attending: Emergency Medicine | Admitting: Emergency Medicine

## 2013-04-22 ENCOUNTER — Encounter (HOSPITAL_COMMUNITY): Payer: Self-pay | Admitting: Emergency Medicine

## 2013-04-22 DIAGNOSIS — Z87448 Personal history of other diseases of urinary system: Secondary | ICD-10-CM | POA: Insufficient documentation

## 2013-04-22 DIAGNOSIS — Z8739 Personal history of other diseases of the musculoskeletal system and connective tissue: Secondary | ICD-10-CM | POA: Insufficient documentation

## 2013-04-22 DIAGNOSIS — N23 Unspecified renal colic: Secondary | ICD-10-CM | POA: Insufficient documentation

## 2013-04-22 HISTORY — DX: Disorder of kidney and ureter, unspecified: N28.9

## 2013-04-22 MED ORDER — OXYCODONE-ACETAMINOPHEN 5-325 MG PO TABS: 1.0000 | ORAL_TABLET | ORAL | Status: DC | PRN

## 2013-04-22 NOTE — ED Notes (Signed)
Pt states she was here on Monday for flank pain (lt side).  Was dx w/ kidney stones.  States that the pain medication is not working.

## 2013-04-22 NOTE — ED Notes (Signed)
She tells me she has had persistent flank pain since this Mon.  Seen here a couple of days ago and dx with left ureteral stone.  She states she felt well, and had less pain yesterday; however, she states the pain recurred yesterday at about 2300 hours and remains.  She also cites one episode of emesis early this morning.  She is alert and oriented x 4 and in no distress.

## 2013-04-22 NOTE — ED Provider Notes (Signed)
CSN: [id number]     Arrival date & time 04/22/13  7829 History  First MD Initiated Contact with Patient 04/22/13 0932     Chief Complaint  Patient presents with  . Flank Pain    HPI The patient was seen in the emergency room on Monday for left-sided flank pain. She had a CT scan that confirmed a 4 mm left ureteral stone. The patient continues to have intermittent severe pain in her left flank. Last night the pain was more severe. She tried taking her Percocet but it was not effective. Patient was told to take 1 tablet every 4 hours as needed for pain. Currently her pain is gone. She is concerned though about when it will come back. Patient tried to call the urologist for followup but was told she needed a certain amount of money up front. The patient does not have the ability to pay that. Past Medical History  Diagnosis Date  . Arthritis     Left Knee  . Renal disorder     kidney stone   Past Surgical History  Procedure Laterality Date  . Tubal ligation     No family history on file. History  Substance Use Topics  . Smoking status: Never Smoker   . Smokeless tobacco: Not on file  . Alcohol Use: No   OB History   Grav Para Term Preterm Abortions TAB SAB Ect Mult Living                 Review of Systems  Constitutional: Negative for fever.  Gastrointestinal: Negative for vomiting and diarrhea.    Allergies  Codeine  Home Medications   Current Outpatient Rx  Name  Route  Sig  Dispense  Refill  . Aspirin-Salicylamide-Caffeine (ARTHRITIS STRENGTH BC POWDER PO)   Oral   Take 1 packet by mouth every 6 (six) hours as needed (For knee pain.).          Marland Kitchen Cyanocobalamin (VITAMIN B-12 PO)   Oral   Take 1 tablet by mouth every morning.         Marland Kitchen ibuprofen (ADVIL,MOTRIN) 800 MG tablet   Oral   Take 1 tablet (800 mg total) by mouth 3 (three) times daily.   21 tablet   0   . Multiple Vitamin (MULTIVITAMIN WITH MINERALS) TABS   Oral   Take 1 tablet by mouth every  morning.          Marland Kitchen oxyCODONE-acetaminophen (PERCOCET) 5-325 MG per tablet   Oral   Take 1 tablet by mouth every 4 (four) hours as needed for pain.   20 tablet   0   . promethazine (PHENERGAN) 25 MG tablet   Oral   Take 1 tablet (25 mg total) by mouth every 6 (six) hours as needed for nausea.   15 tablet   0   . tamsulosin (FLOMAX) 0.4 MG CAPS capsule   Oral   Take 1 capsule (0.4 mg total) by mouth daily.   10 capsule   0    BP 130/70  Pulse 76  Temp(Src) 98.4 F (36.9 C) (Oral)  Resp 20  SpO2 99%  LMP 03/30/2013 Physical Exam  Nursing note and vitals reviewed. Constitutional: She appears well-developed and well-nourished. No distress.  HENT:  Head: Normocephalic and atraumatic.  Right Ear: External ear normal.  Left Ear: External ear normal.  Eyes: Conjunctivae are normal. Right eye exhibits no discharge. Left eye exhibits no discharge. No scleral icterus.  Neck: Neck supple. No  tracheal deviation present.  Cardiovascular: Normal rate, regular rhythm and intact distal pulses.   Pulmonary/Chest: Effort normal and breath sounds normal. No stridor. No respiratory distress. She has no wheezes. She has no rales.  Abdominal: Soft. Bowel sounds are normal. She exhibits no distension. There is no tenderness. There is no rebound and no guarding.  Musculoskeletal: She exhibits no edema and no tenderness.  Neurological: She is alert. She has normal strength. No sensory deficit. Cranial nerve deficit:  no gross defecits noted. She exhibits normal muscle tone. She displays no seizure activity. Coordination normal.  Skin: Skin is warm and dry. No rash noted.  Psychiatric: She has a normal mood and affect.    ED Course   Procedures (including critical care time)  Labs Reviewed - No data to display No results found.  MDM  The patient is currently pain-free. She can increase her oxycodone to 2 tablets every 4 hours as needed for pain. Her antinausea medication seems to be  effective. I explained the patient to return to emergency room if this is not effective and we can assist her in  getting urology followup.  Celene Kras, MD 04/22/13 1003

## 2014-03-18 LAB — URINALYSIS W/ RFLX MICROSCOPIC
Bilirubin: NEGATIVE
Blood: NEGATIVE
Glucose: NEGATIVE mg/dL
Ketone: NEGATIVE mg/dL
Leukocyte Esterase: NEGATIVE
Nitrites: NEGATIVE
Protein: NEGATIVE mg/dL
Specific gravity: 1.019 (ref 1.001–1.023)
Urobilinogen: 0.2 EU/dL (ref 0.2–1.0)
pH (UA): 6.5 (ref 5.0–9.0)

## 2014-03-18 LAB — METABOLIC PANEL, COMPREHENSIVE
A-G Ratio: 0.9 — ABNORMAL LOW (ref 1.2–3.5)
ALT (SGPT): 56 U/L (ref 12–65)
AST (SGOT): 31 U/L (ref 15–37)
Albumin: 3.8 g/dL (ref 3.5–5.0)
Alk. phosphatase: 120 U/L (ref 50–136)
Anion gap: 9 mmol/L (ref 7–16)
BUN: 10 MG/DL (ref 6–23)
Bilirubin, total: 0.4 MG/DL (ref 0.2–1.1)
CO2: 27 mmol/L (ref 21–32)
Calcium: 9 MG/DL (ref 8.3–10.4)
Chloride: 103 mmol/L (ref 98–107)
Creatinine: 0.8 MG/DL (ref 0.6–1.0)
GFR est AA: >60 mL/min/{1.73_m2} (ref >60)
GFR est non-AA: >60 mL/min/{1.73_m2} (ref >60)
Globulin: 4.1 g/dL — ABNORMAL HIGH (ref 2.3–3.5)
Glucose: 83 mg/dL (ref 65–100)
Potassium: 3.6 mmol/L (ref 3.5–5.1)
Protein, total: 7.9 g/dL (ref 6.3–8.2)
Sodium: 139 mmol/L (ref 136–145)

## 2014-03-18 LAB — CBC W/O DIFF
HCT: 38.9 % (ref 35.8–46.3)
HGB: 12.4 g/dL (ref 11.7–15.4)
MCH: 28.2 PG (ref 26.1–32.9)
MCHC: 31.9 g/dL (ref 31.4–35.0)
MCV: 88.4 FL (ref 79.6–97.8)
MPV: 9.7 FL — ABNORMAL LOW (ref 10.8–14.1)
PLATELET: 336 10*3/uL (ref 150–450)
RBC: 4.4 M/uL (ref 4.05–5.25)
RDW: 13.4 % (ref 11.9–14.6)
WBC: 12 10*3/uL — ABNORMAL HIGH (ref 4.3–11.1)

## 2014-03-18 LAB — PROTHROMBIN TIME + INR
INR: 1.1 (ref 0.9–1.2)
Prothrombin time: 11.2 s (ref 9.6–12.0)

## 2014-03-18 LAB — PTT: aPTT: 31.6 s (ref 25.3–32.9)

## 2014-03-18 NOTE — Other (Signed)
Cbc, cmp, pt, ptt & ua results WNL for surgery per anesthesia guidelines.  Labs faxed to Dr. Alma Friendly office for her review.  Elevated WBC 12.0.    Recent Results (from the past 12 hour(s))   URINALYSIS W/ RFLX MICROSCOPIC    Collection Time: 03/18/14  1:15 PM   Result Value Ref Range    Color YELLOW      Appearance CLOUDY      Specific gravity 1.019 1.001 - 1.023      pH (UA) 6.5 5.0 - 9.0      Protein NEGATIVE  NEG mg/dL    Glucose NEGATIVE  mg/dL    Ketone NEGATIVE  NEG mg/dL    Bilirubin NEGATIVE  NEG      Blood NEGATIVE  NEG      Urobilinogen 0.2 0.2 - 1.0 EU/dL    Nitrites NEGATIVE  NEG      Leukocyte Esterase NEGATIVE  NEG     CBC W/O DIFF    Collection Time: 03/18/14  1:24 PM   Result Value Ref Range    WBC 12.0 (H) 4.3 - 11.1 K/uL    RBC 4.40 4.05 - 5.25 M/uL    HGB 12.4 11.7 - 15.4 g/dL    HCT 38.9 35.8 - 46.3 %    MCV 88.4 79.6 - 97.8 FL    MCH 28.2 26.1 - 32.9 PG    MCHC 31.9 31.4 - 35.0 g/dL    RDW 13.4 11.9 - 14.6 %    PLATELET 336 150 - 450 K/uL    MPV 9.7 (L) 10.8 - 21.3 FL   METABOLIC PANEL, COMPREHENSIVE    Collection Time: 03/18/14  1:24 PM   Result Value Ref Range    Sodium 139 136 - 145 mmol/L    Potassium 3.6 3.5 - 5.1 mmol/L    Chloride 103 98 - 107 mmol/L    CO2 27 21 - 32 mmol/L    Anion gap 9 7 - 16 mmol/L    Glucose 83 65 - 100 mg/dL    BUN 10 6 - 23 MG/DL    Creatinine 0.80 0.6 - 1.0 MG/DL    GFR est AA >60 >60 ml/min/1.21m    GFR est non-AA >60 >60 ml/min/1.790m   Calcium 9.0 8.3 - 10.4 MG/DL    Bilirubin, total 0.4 0.2 - 1.1 MG/DL    ALT 56 12 - 65 U/L    AST 31 15 - 37 U/L    Alk. phosphatase 120 50 - 136 U/L    Protein, total 7.9 6.3 - 8.2 g/dL    Albumin 3.8 3.5 - 5.0 g/dL    Globulin 4.1 (H) 2.3 - 3.5 g/dL    A-G Ratio 0.9 (L) 1.2 - 3.5     PROTHROMBIN TIME + INR    Collection Time: 03/18/14  1:24 PM   Result Value Ref Range    Prothrombin time 11.2 9.6 - 12.0 sec    INR 1.1 0.9 - 1.2     PTT    Collection Time: 03/18/14  1:24 PM   Result Value Ref Range     aPTT 31.6 25.3 - 32.9 SEC

## 2014-03-18 NOTE — Other (Signed)
Patient verified name, DOB, and surgery as listed in [facility].    Type 2 surgery, pre- assessment complete.    Labs per surgeon: cbc, cmp, pt, ptt & ua done per Dr. Arnoldo HookerAlt's orders - results pending.  Labs per anesthesia protocol: no other labs required today  EKG no ekg required per anesthesia guidelines.    Will call for last medical office note from pt's PCP office,Matthew P Roehrs, MD (General), to have for anesthesiologist to review due to pt's hx of DVT 2014 & pt taking xarelto.    Hibiclens and instructions given per hospital policy.    Patient provided with handouts including Guide to Surgery, Pain Management, Hand Hygiene, Blood Transfusion Education, and [employer] Anesthesia Brochure.    Patient answered medical/surgical history questions at their best of ability. All prior to admission medications documented in [facility].    Patient instructed to hold all vitamins 7 days prior to surgery and NSAIDS 5 days prior to surgery, patient verbalized understanding.    Patient instructed to continue previous medications as prescribed prior to surgery and to take the following medications the day of surgery according to anesthesia guidelines with a small sip of water: none    Pt instructed to stop her xarelto 3 days prior to surgery per anesthesia guidelines.

## 2014-03-19 NOTE — Other (Signed)
Called Dr. Nicola Police office, left detailed voicemail message for office RN requesting she fax most recent office note and lab report to [phone] for anesthesia to review pre op.

## 2014-03-23 NOTE — H&P (Signed)
History and Physical      Marissa Perry:   Physician:  Duwaine Maxin. Marissa Tafolla, DO    This 38 y.o. female presents today for pre-operative evaluation.    History:  This 38 y.o. G5 P4  patient has history of  heavy bleeding irregular bleeding.  Hx of dvt on xarelto.  She was advised to stop xarelto and advised 2% risk of clot.  OCPs / IUD.  She had a pelvic US that showed overall volume of 143.87ml with focal adenomyosis.  Left ovary wnl with bowel present.        OB History    Gravida Para Term Preterm AB TAB SAB Ectopic Multiple Living    5 4                  Past Medical History   Diagnosis Date   ??? Anxiety      no present treatment    ??? Thromboembolus (HCC) 9/14     LLE; pt takes xarelto daily   ??? Unspecified adverse effect of anesthesia      has woken up after anesthesia "violent" in the past; pt states just due stressfull situations   ??? Menorrhagia    ??? Enlarged uterus    ??? Adenomyosis         has past surgical history that includes cervical fusion (09/2012 at [facility]); endoscopy (12/2013); colonoscopy (12/2013); wisdom teeth extraction; cesarean section (1993); and dilation and curettage.    No current facility-administered medications for this encounter.     Current Outpatient Prescriptions   Medication Sig Dispense   ??? rivaroxaban (XARELTO) 20 mg tab tablet Take 20 mg by mouth daily. Stop taking 3 days prior to surgery per Anesthesia guidelines. Last dose 03/21/14   Indications: DEEP VEIN THROMBOSIS PREVENTION        No Known Allergies.     reports that she has quit smoking. She has never used smokeless tobacco. She reports that she drinks alcohol. She reports that she uses illicit drugs (Marijuana).    family history includes Hypertension in her father.      Physical Exam:    There were no vitals filed for this visit.    Patient without distress.  Heart: Regular rate and rhythm   Lung: clear to auscultation throughout lung fields, no wheezes, no rales, no rhonchi and normal respiratory effort  Abdomen: soft, nontender   Lower Extremities:  - Edema No    Findings/Diagnosis:   Pre-Op Evaluation done today.  abnormal uterine bleeding /adenomyosis     Surgery to be Performed:  TLH with bilateral salpingectomy  Surgery Date:    Surgery Place:  [facility]  Surgery Duration:  1.5 hour  Surgery Type:  Assistant Surgeon:      The risks of surgery were discussed in detail, including anesthesia, hemorrhage, blood clots, infection, damage to other organs such as bowel, bladder, ureters.  Also the possible need for catheter use at home after surgery.  Time away from work and physical restrictions discussed.      Also the possible need for catheter use at home after surgery.  Pt understands that complications aren't anticipated but that if they should occur than corrective procedures would be performed as indicated including, but not limited to, need for: transfusion, antibiotics, and additional surgical procedures including modification/extension of incision (possible vertical incision), colostomy, reimplantation of ureters, need for prolonged catheter drainage should urologic injury occur & other procedures as indicated.  Unanticipated reactions to anesthesia as well  as the small possibility of death were all discussed.  All of the patient's questions/concerns were answered.  She was encouraged to call me if she has additional questions/concerns prior to surgery.    Pt understands & states she wants to proceed w/surgery.  Pt instructed to stay off xarelto for 5 days prior to surgery.

## 2014-03-24 NOTE — Anesthesia Pre-Procedure Evaluation (Addendum)
Anesthetic History   No history of anesthetic complications            Review of Systems / Medical History  Patient summary reviewed and pertinent labs reviewed    Pulmonary  Within defined limits                 Neuro/Psych         Psychiatric history (anxiety)     Cardiovascular                  Exercise tolerance: >4 METS     GI/Hepatic/Renal  Within defined limits              Endo/Other        Obesity     Other Findings   Comments: H/O DVT - on Xarelto         Physical Exam    Airway  Mallampati: I  TM Distance: 4 - 6 cm  Neck ROM: decreased range of motion   Mouth opening: Normal     Cardiovascular    Rhythm: regular  Rate: normal         Dental         Pulmonary  Breath sounds clear to auscultation               Abdominal         Other Findings            Anesthetic Plan    ASA: 2  Anesthesia type: general          Induction: Intravenous  Anesthetic plan and risks discussed with: Patient

## 2014-03-25 ENCOUNTER — Inpatient Hospital Stay: Payer: PRIVATE HEALTH INSURANCE

## 2014-03-25 LAB — PROTHROMBIN TIME + INR
INR: 1 (ref 0.9–1.2)
Prothrombin time: 10.3 s (ref 9.6–12.0)

## 2014-03-25 LAB — HCG URINE, QL. - POC: Pregnancy test,urine (POC): NEGATIVE

## 2014-03-25 MED ORDER — SODIUM CHLORIDE 0.9 % IJ SYRG
Freq: Three times a day (TID) | INTRAMUSCULAR | Status: DC
Start: 2014-03-25 — End: 2014-03-25
  Administered 2014-03-25: 11:00:00 via INTRAVENOUS

## 2014-03-25 MED ORDER — ONDANSETRON (PF) 4 MG/2 ML INJECTION
4 mg/2 mL | INTRAMUSCULAR | Status: DC | PRN
Start: 2014-03-25 — End: 2014-03-26

## 2014-03-25 MED ORDER — LIDOCAINE (PF) 20 MG/ML (2 %) IJ SOLN
20 mg/mL (2 %) | INTRAMUSCULAR | Status: DC | PRN
Start: 2014-03-25 — End: 2014-03-25
  Administered 2014-03-25: 12:00:00 via INTRAVENOUS

## 2014-03-25 MED ORDER — HYDROMORPHONE (PF) 1 MG/ML IJ SOLN
1 mg/mL | INTRAMUSCULAR | Status: DC | PRN
Start: 2014-03-25 — End: 2014-03-26

## 2014-03-25 MED ORDER — LIDOCAINE HCL 1 % (10 MG/ML) IJ SOLN
10 mg/mL (1 %) | INTRAMUSCULAR | Status: DC | PRN
Start: 2014-03-25 — End: 2014-03-25
  Administered 2014-03-25: 11:00:00 via SUBCUTANEOUS

## 2014-03-25 MED ORDER — PROPOFOL 10 MG/ML IV EMUL
10 mg/mL | INTRAVENOUS | Status: AC
Start: 2014-03-25 — End: ?

## 2014-03-25 MED ORDER — PHENAZOPYRIDINE 100 MG TAB
100 mg | Freq: Once | ORAL | Status: AC
Start: 2014-03-25 — End: 2014-03-25
  Administered 2014-03-25: 15:00:00 via ORAL

## 2014-03-25 MED ORDER — FENTANYL CITRATE (PF) 50 MCG/ML IJ SOLN
50 mcg/mL | INTRAMUSCULAR | Status: AC
Start: 2014-03-25 — End: ?

## 2014-03-25 MED ORDER — LACTATED RINGERS IV
INTRAVENOUS | Status: DC
Start: 2014-03-25 — End: 2014-03-25
  Administered 2014-03-25: 17:00:00 via INTRAVENOUS

## 2014-03-25 MED ORDER — DIPHENHYDRAMINE HCL 50 MG/ML IJ SOLN
50 mg/mL | INTRAMUSCULAR | Status: DC | PRN
Start: 2014-03-25 — End: 2014-03-25

## 2014-03-25 MED ORDER — SODIUM CHLORIDE 0.9 % IJ SYRG
INTRAMUSCULAR | Status: DC | PRN
Start: 2014-03-25 — End: 2014-03-25

## 2014-03-25 MED ORDER — GLYCOPYRROLATE 0.2 MG/ML IJ SOLN
0.2 mg/mL | INTRAMUSCULAR | Status: DC | PRN
Start: 2014-03-25 — End: 2014-03-25
  Administered 2014-03-25: 13:00:00 via INTRAVENOUS

## 2014-03-25 MED ORDER — NEOSTIGMINE METHYLSULFATE 1 MG/ML INJECTION
1 mg/mL | INTRAMUSCULAR | Status: DC | PRN
Start: 2014-03-25 — End: 2014-03-25
  Administered 2014-03-25: 13:00:00 via INTRAVENOUS

## 2014-03-25 MED ORDER — PROMETHAZINE 25 MG/ML INJECTION
25 mg/mL | Freq: Once | INTRAMUSCULAR | Status: DC
Start: 2014-03-25 — End: 2014-03-25
  Administered 2014-03-25: 14:00:00 via INTRAVENOUS

## 2014-03-25 MED ORDER — ROCURONIUM 10 MG/ML IV
10 mg/mL | INTRAVENOUS | Status: DC | PRN
Start: 2014-03-25 — End: 2014-03-25
  Administered 2014-03-25: 12:00:00 via INTRAVENOUS

## 2014-03-25 MED ORDER — HYDROMORPHONE (PF) 2 MG/ML IJ SOLN
2 mg/mL | INTRAMUSCULAR | Status: AC | PRN
Start: 2014-03-25 — End: 2014-03-25
  Administered 2014-03-25 (×4): via INTRAVENOUS

## 2014-03-25 MED ORDER — PHENYLEPHRINE 10 MG/ML INJECTION
10 mg/mL | INTRAMUSCULAR | Status: AC
Start: 2014-03-25 — End: ?

## 2014-03-25 MED ORDER — GLYCOPYRROLATE 0.2 MG/ML IJ SOLN
0.2 mg/mL | INTRAMUSCULAR | Status: AC
Start: 2014-03-25 — End: ?

## 2014-03-25 MED ORDER — OXYCODONE-ACETAMINOPHEN 5 MG-325 MG TAB
5-325 mg | ORAL | Status: DC | PRN
Start: 2014-03-25 — End: 2014-03-25

## 2014-03-25 MED ORDER — FAMOTIDINE 20 MG TAB
20 mg | Freq: Once | ORAL | Status: AC
Start: 2014-03-25 — End: 2014-03-25
  Administered 2014-03-25: 11:00:00 via ORAL

## 2014-03-25 MED ORDER — DEXAMETHASONE SODIUM PHOSPHATE 4 MG/ML IJ SOLN
4 mg/mL | INTRAMUSCULAR | Status: DC | PRN
Start: 2014-03-25 — End: 2014-03-25

## 2014-03-25 MED ORDER — LACTATED RINGERS IV
INTRAVENOUS | Status: DC
Start: 2014-03-25 — End: 2014-03-25
  Administered 2014-03-25 (×2): via INTRAVENOUS

## 2014-03-25 MED ORDER — SODIUM CHLORIDE 0.9 % IJ SYRG
Freq: Three times a day (TID) | INTRAMUSCULAR | Status: DC
Start: 2014-03-25 — End: 2014-03-26
  Administered 2014-03-25 – 2014-03-26 (×2): via INTRAVENOUS

## 2014-03-25 MED ORDER — NEOSTIGMINE METHYLSULFATE 1 MG/ML INJECTION
1 mg/mL | INTRAMUSCULAR | Status: AC
Start: 2014-03-25 — End: ?

## 2014-03-25 MED ORDER — ONDANSETRON (PF) 4 MG/2 ML INJECTION
4 mg/2 mL | INTRAMUSCULAR | Status: AC
Start: 2014-03-25 — End: ?

## 2014-03-25 MED ORDER — KETOROLAC TROMETHAMINE 30 MG/ML INJECTION
30 mg/mL (1 mL) | Freq: Four times a day (QID) | INTRAMUSCULAR | Status: DC
Start: 2014-03-25 — End: 2014-03-26
  Administered 2014-03-25 – 2014-03-26 (×4): via INTRAVENOUS

## 2014-03-25 MED ORDER — ZOLPIDEM 5 MG TAB
5 mg | Freq: Every evening | ORAL | Status: DC | PRN
Start: 2014-03-25 — End: 2014-03-26

## 2014-03-25 MED ORDER — PROPOFOL 10 MG/ML IV EMUL
10 mg/mL | INTRAVENOUS | Status: DC | PRN
Start: 2014-03-25 — End: 2014-03-25
  Administered 2014-03-25: 12:00:00 via INTRAVENOUS

## 2014-03-25 MED ORDER — OXYCODONE-ACETAMINOPHEN 7.5 MG-325 MG TAB
ORAL | Status: DC | PRN
Start: 2014-03-25 — End: 2014-03-26
  Administered 2014-03-25: 22:00:00 via ORAL

## 2014-03-25 MED ORDER — MIDAZOLAM 1 MG/ML IJ SOLN
1 mg/mL | Freq: Once | INTRAMUSCULAR | Status: AC
Start: 2014-03-25 — End: 2014-03-25
  Administered 2014-03-25: 12:00:00 via INTRAVENOUS

## 2014-03-25 MED ORDER — LACTATED RINGERS IV
INTRAVENOUS | Status: DC
Start: 2014-03-25 — End: 2014-03-25
  Administered 2014-03-25: 14:00:00 via INTRAVENOUS

## 2014-03-25 MED ORDER — DIPHENHYDRAMINE HCL 50 MG/ML IJ SOLN
50 mg/mL | INTRAMUSCULAR | Status: DC | PRN
Start: 2014-03-25 — End: 2014-03-26

## 2014-03-25 MED ORDER — PROMETHAZINE 25 MG/ML INJECTION
25 mg/mL | Freq: Four times a day (QID) | INTRAMUSCULAR | Status: DC | PRN
Start: 2014-03-25 — End: 2014-03-26

## 2014-03-25 MED ORDER — ROCURONIUM 10 MG/ML IV
10 mg/mL | INTRAVENOUS | Status: AC
Start: 2014-03-25 — End: ?

## 2014-03-25 MED ORDER — LIDOCAINE (PF) 20 MG/ML (2 %) IV SYRINGE
100 mg/5 mL (2 %) | INTRAVENOUS | Status: AC
Start: 2014-03-25 — End: ?

## 2014-03-25 MED ORDER — CEFAZOLIN 2 GRAM/50 ML NS IVPB
Freq: Once | INTRAVENOUS | Status: AC
Start: 2014-03-25 — End: 2014-03-25
  Administered 2014-03-25: 12:00:00 via INTRAVENOUS

## 2014-03-25 MED ORDER — ONDANSETRON (PF) 4 MG/2 ML INJECTION
4 mg/2 mL | INTRAMUSCULAR | Status: DC | PRN
Start: 2014-03-25 — End: 2014-03-25
  Administered 2014-03-25: 13:00:00 via INTRAVENOUS

## 2014-03-25 MED ORDER — IBUPROFEN 800 MG TAB
800 mg | Freq: Four times a day (QID) | ORAL | Status: DC | PRN
Start: 2014-03-25 — End: 2014-03-26

## 2014-03-25 MED ORDER — RIVAROXABAN 20 MG TAB
20 mg | Freq: Every day | ORAL | Status: DC
Start: 2014-03-25 — End: 2014-03-26
  Administered 2014-03-26: 12:00:00 via ORAL

## 2014-03-25 MED ORDER — DEXAMETHASONE SODIUM PHOSPHATE 4 MG/ML IJ SOLN
4 mg/mL | INTRAMUSCULAR | Status: DC | PRN
Start: 2014-03-25 — End: 2014-03-25
  Administered 2014-03-25: 12:00:00 via INTRAVENOUS

## 2014-03-25 MED ORDER — ONDANSETRON (PF) 4 MG/2 ML INJECTION
4 mg/2 mL | Freq: Once | INTRAMUSCULAR | Status: DC
Start: 2014-03-25 — End: 2014-03-25
  Administered 2014-03-25: 14:00:00 via INTRAVENOUS

## 2014-03-25 MED ORDER — FENTANYL CITRATE (PF) 50 MCG/ML IJ SOLN
50 mcg/mL | INTRAMUSCULAR | Status: DC | PRN
Start: 2014-03-25 — End: 2014-03-25

## 2014-03-25 MED ORDER — SODIUM CHLORIDE 0.9 % IJ SYRG
INTRAMUSCULAR | Status: DC | PRN
Start: 2014-03-25 — End: 2014-03-26
  Administered 2014-03-26: 07:00:00 via INTRAVENOUS

## 2014-03-25 MED ORDER — HYDROCODONE-ACETAMINOPHEN 5 MG-325 MG TAB
5-325 mg | ORAL | Status: DC | PRN
Start: 2014-03-25 — End: 2014-03-25

## 2014-03-25 MED ORDER — EPHEDRINE SULFATE 50 MG/ML IJ SOLN
50 mg/mL | INTRAMUSCULAR | Status: AC
Start: 2014-03-25 — End: ?

## 2014-03-25 MED ORDER — DEXAMETHASONE SODIUM PHOSPHATE 10 MG/ML IJ SOLN
10 mg/mL | INTRAMUSCULAR | Status: AC
Start: 2014-03-25 — End: ?

## 2014-03-25 MED ORDER — FAMOTIDINE (PF) 20 MG/2 ML IV
20 mg/2 mL | Freq: Once | INTRAVENOUS | Status: DC
Start: 2014-03-25 — End: 2014-03-25
  Administered 2014-03-25: 11:00:00 via INTRAVENOUS

## 2014-03-25 MED ORDER — FENTANYL CITRATE (PF) 50 MCG/ML IJ SOLN
50 mcg/mL | Freq: Once | INTRAMUSCULAR | Status: DC
Start: 2014-03-25 — End: 2014-03-25
  Administered 2014-03-25: 11:00:00 via INTRAVENOUS

## 2014-03-25 MED ORDER — LIDOCAINE-PRILOCAINE 2.5 %-2.5 % TOPICAL CREAM
Freq: Once | CUTANEOUS | Status: DC
Start: 2014-03-25 — End: 2014-03-25
  Administered 2014-03-25: 11:00:00 via TOPICAL

## 2014-03-25 MED ORDER — FENTANYL CITRATE (PF) 50 MCG/ML IJ SOLN
50 mcg/mL | INTRAMUSCULAR | Status: DC | PRN
Start: 2014-03-25 — End: 2014-03-25
  Administered 2014-03-25 (×4): via INTRAVENOUS

## 2014-03-25 MED FILL — CEFAZOLIN 2 GRAM/50 ML NS IVPB: INTRAVENOUS | Qty: 50

## 2014-03-25 MED FILL — NEOSTIGMINE METHYLSULFATE 1 MG/ML INJECTION: 1 mg/mL | INTRAMUSCULAR | Qty: 3

## 2014-03-25 MED FILL — ROCURONIUM 10 MG/ML IV: 10 mg/mL | INTRAVENOUS | Qty: 5

## 2014-03-25 MED FILL — EPHEDRINE SULFATE 50 MG/ML IJ SOLN: 50 mg/mL | INTRAMUSCULAR | Qty: 1

## 2014-03-25 MED FILL — ONDANSETRON (PF) 4 MG/2 ML INJECTION: 4 mg/2 mL | INTRAMUSCULAR | Qty: 2

## 2014-03-25 MED FILL — GLYCOPYRROLATE 0.2 MG/ML IJ SOLN: 0.2 mg/mL | INTRAMUSCULAR | Qty: 2

## 2014-03-25 MED FILL — DEXAMETHASONE SODIUM PHOSPHATE 10 MG/ML IJ SOLN: 10 mg/mL | INTRAMUSCULAR | Qty: 5

## 2014-03-25 MED FILL — PROPOFOL 10 MG/ML IV EMUL: 10 mg/mL | INTRAVENOUS | Qty: 20

## 2014-03-25 MED FILL — NEOSTIGMINE METHYLSULFATE 1 MG/ML INJECTION: 1 mg/mL | INTRAMUSCULAR | Qty: 10

## 2014-03-25 MED FILL — LIDOCAINE (PF) 20 MG/ML (2 %) IV SYRINGE: 100 mg/5 mL (2 %) | INTRAVENOUS | Qty: 5

## 2014-03-25 MED FILL — KETOROLAC TROMETHAMINE 30 MG/ML INJECTION: 30 mg/mL (1 mL) | INTRAMUSCULAR | Qty: 1

## 2014-03-25 MED FILL — HYDROMORPHONE (PF) 1 MG/ML IJ SOLN: 1 mg/mL | INTRAMUSCULAR | Qty: 1

## 2014-03-25 MED FILL — FAMOTIDINE 20 MG TAB: 20 mg | ORAL | Qty: 1

## 2014-03-25 MED FILL — FENTANYL CITRATE (PF) 50 MCG/ML IJ SOLN: 50 mcg/mL | INTRAMUSCULAR | Qty: 2

## 2014-03-25 MED FILL — MIDAZOLAM 1 MG/ML IJ SOLN: 1 mg/mL | INTRAMUSCULAR | Qty: 2

## 2014-03-25 MED FILL — HYDROMORPHONE (PF) 2 MG/ML IJ SOLN: 2 mg/mL | INTRAMUSCULAR | Qty: 1

## 2014-03-25 MED FILL — PHENAZOPYRIDINE 100 MG TAB: 100 mg | ORAL | Qty: 1

## 2014-03-25 MED FILL — LACTATED RINGERS IV: INTRAVENOUS | Qty: 1000

## 2014-03-25 MED FILL — ROCURONIUM 10 MG/ML IV: 10 mg/mL | INTRAVENOUS | Qty: 50

## 2014-03-25 MED FILL — OXYCODONE-ACETAMINOPHEN 7.5 MG-325 MG TAB: ORAL | Qty: 1

## 2014-03-25 MED FILL — DEXAMETHASONE SODIUM PHOSPHATE 10 MG/ML IJ SOLN: 10 mg/mL | INTRAMUSCULAR | Qty: 10

## 2014-03-25 MED FILL — PHENYLEPHRINE 10 MG/ML INJECTION: 10 mg/mL | INTRAMUSCULAR | Qty: 1

## 2014-03-25 NOTE — Other (Signed)
TRANSFER - OUT REPORT:    Verbal report given to [facility]) on Marissa Perry  being transferred to 362(unit) for routine progression of care       Report consisted of patient???s Situation, Background, Assessment and   Recommendations(SBAR).     Information from the following report(s) OR Summary, Procedure Summary, Intake/Output and MAR was reviewed with the receiving nurse.    Opportunity for questions and clarification was provided.      Patient transported with:   O2 @ 0 liters  Tech

## 2014-03-25 NOTE — Progress Notes (Signed)
lortab 7.5 given po for 3/10 abd pain. Continue to monitor.

## 2014-03-25 NOTE — Anesthesia Post-Procedure Evaluation (Signed)
Post-Anesthesia Evaluation and Assessment    Patient: Marissa Perry MRN: [id number]  SSN: [id number]    Date of Birth: 1976/04/09  Age: 38 y.o.  Sex: female       Cardiovascular Function/Vital Signs  Visit Vitals   Item Reading   ??? BP 113/61 mmHg   ??? Pulse 59   ??? Temp 36.1 ??C (97 ??F)   ??? Resp 16   ??? Ht 5\' 3"  (1.6 m)   ??? Wt 87.544 kg (193 lb)   ??? BMI 34.20 kg/m2   ??? SpO2 98%       Patient is status post general anesthesia for Procedure(s):  HYSTERECTOMY TOTAL LAPAROSCOPIC WITH BILATERAL SALPINGECTOMIES.    Nausea/Vomiting: None    Postoperative hydration reviewed and adequate.    Pain:  Pain Scale 1: Numeric (0 - 10) (03/25/14 1001)  Pain Intensity 1: 8 (03/25/14 1001)   Managed    Neurological Status:   Neuro (WDL): Exceptions to WDL (03/25/14 0951)  Neuro  Neurologic State: Confused (03/25/14 0951)  Speech: Clear (03/25/14 0951)  LUE Motor Response: Purposeful (03/25/14 0951)  LLE Motor Response: Purposeful (03/25/14 0951)  RUE Motor Response: Purposeful (03/25/14 0951)  RLE Motor Response: Purposeful (03/25/14 0951)   At baseline    Mental Status and Level of Consciousness: Alert and oriented     Pulmonary Status:   O2 Device: Room air (03/25/14 1036)   Adequate oxygenation and airway patent    Complications related to anesthesia: None    Post-anesthesia assessment completed. No concerns    Signed By: Jeannene Patella, MD     March 25, 2014

## 2014-03-25 NOTE — Progress Notes (Signed)
Chaplain's Pre-op visit requested by patient. Conveyed care and concern for patient and family. Offered prayer as requested for patient, family, and staff.    Adrian Duckett, MDiv, BS  Board Certified Chaplain

## 2014-03-25 NOTE — Progress Notes (Signed)
Dilaudid 1mg  given slowly IV for 8/10 abd pain. Continue to monitor.

## 2014-03-25 NOTE — Progress Notes (Signed)
Pt ambulated from 62 to nurses station then pt and husband walked around circle. Tolerated well.

## 2014-03-25 NOTE — Other (Signed)
CARE DATE OF 03/26/14 by Estill Cotta, RN     Review Status Review Entered     In Primary 03/26/2014     Details      Patient states passing flatus. Rates abdominal pain 2/10. Scheduled Toradol 30 mg IV given. Will monitor.       Visit Vitals    Item  Reading    ???  BP  100/56 mmHg    ???  Pulse  66    ???  Temp  98.3 ??F (36.8 ??C)    ???  Resp  18    ???  Ht  5\' 3"  (1.6 m)    ???  Wt  87.544 kg (193 lb)    ???  BMI  34.20 kg/m2    ???  SpO2  100%    ???  Breastfeeding  No      Current facility-administered medications: rivaroxaban (XARELTO) tablet 20 mg, 20 mg, Oral, DAILY WITH BREAKFAST, Brandi Kay Alt, DO, 20 mg at 03/26/14 0733; sodium chloride (NS) flush 5-10 mL, 5-10 mL, IntraVENous, Q8H, Brandi Kay Alt, DO, Stopped at 03/26/14 0600; sodium chloride (NS) flush 5-10 mL, 5-10 mL, IntraVENous, PRN, Brandi Kay Alt, DO, 10 mL at 03/26/14 0303  [START ON 03/27/2014] ibuprofen (MOTRIN) tablet 800 mg, 800 mg, Oral, Q6H PRN, Malcolm Metro Alt, DO; ketorolac (TORADOL) injection 30 mg, 30 mg, IntraVENous, Q6H, Brandi Kay Alt, DO, 30 mg at 03/26/14 0733; ondansetron (ZOFRAN) injection 4 mg, 4 mg, IntraVENous, Q4H PRN, Brandi Kay Alt, DO; promethazine (PHENERGAN) injection 12.5 mg, 12.5 mg, IntraVENous, Q6H PRN, Brandi Kay Alt, DO  diphenhydrAMINE (BENADRYL) injection 12.5 mg, 12.5 mg, IntraVENous, Q4H PRN, Brandi Kay Alt, DO; zolpidem (AMBIEN) tablet 5 mg, 5 mg, Oral, QHS PRN, Brandi Kay Alt, DO; oxyCODONE-acetaminophen (PERCOCET 7.5) 7.5-325 mg per tablet 1 Tab, 1 Tab, Oral, Q4H PRN, Malcolm Metro Alt, DO, 1 Tab at 03/25/14 1816; HYDROmorphone (PF) (DILAUDID) injection 1 mg, 1 mg, IntraVENous, Q4H PRN, Malcolm Metro Alt, DO              ADMIT CARE DATE OF 03/25/14 OBS by Estill Cotta, RN     Review Status Review Entered     In Primary 03/26/2014     Details      This 38 y.o. female presents today for pre-operative evaluation.    History: This 38 y.o. G5 P4 patient has history of heavy bleeding  irregular bleeding. Hx of dvt on xarelto. She was advised to stop xarelto and advised 2% risk of clot. OCPs / IUD. She had a pelvic US that showed overall volume of 143.87ml with focal adenomyosis. Left ovary wnl with bowel present.   Past Medical History    Diagnosis  Date    ???  Anxiety       no present treatment    ???  Thromboembolus (HCC)  9/14      LLE; pt takes xarelto daily    ???  Menorrhagia     ???  Enlarged uterus     ???  Adenomyosis     ???  Abnormal uterine bleeding (AUB)  03/23/2014    ???  History of DVT (deep vein thrombosis)  03/23/2014    ???  Unspecified adverse effect of anesthesia       has woken up after anesthesia "violent" in the past; pt states just due stressfull situations    ???  Psychiatric disorder       No current facility-administered medications on file  prior to encounter.      No current outpatient prescriptions on file prior to encounter.      Past Surgical History    Procedure  Laterality  Date    ???  Hx cervical fusion   09/2012 at [facility]      C6 - C7 with hardware; Dr. Kirstie Peri    ???  Hx endoscopy   12/2013    ???  Hx colonoscopy   12/2013    ???  Hx wisdom teeth extraction      ???  Hx cesarean section   1993    ???  Hx dilation and curettage        due to miscarriage      Findings/Diagnosis:   Pre-Op Evaluation done today. abnormal uterine bleeding /adenomyosis  Surgery to be Performed: TLH with bilateral salpingectomy  Surgery Date:   Surgery Place: [facility]  Surgery Duration: 1.5 hour  Surgery Type:  Assistant Surgeon:     The risks of surgery were discussed in detail, including anesthesia, hemorrhage, blood clots, infection, damage to other organs such as bowel, bladder, ureters. Also the possible need for catheter use at home after surgery. Time away from work and physical restrictions discussed.     Also the possible need for catheter use at home after surgery.  Pt understands that complications aren't anticipated but that if they should occur than corrective procedures would be performed as indicated  including, but not limited to, need for: transfusion, antibiotics, and additional surgical procedures including modification/extension of incision (possible vertical incision), colostomy, reimplantation of ureters, need for prolonged catheter drainage should urologic injury occur & other procedures as indicated. Unanticipated reactions to anesthesia as well as the small possibility of death were all discussed. All of the patient's questions/concerns were answered. She was encouraged to call me if she has additional questions/concerns prior to surgery.   Pt understands & states she wants to proceed w/surgery.  Pt instructed to stay off xarelto for 5 days prior to surgery.   Date of Surgery: 03/25/2014     Preoperative Diagnosis: MENORRHAGIA/ENLARGED UTERUS ADENOMYOSIS     Postoperative Diagnosis: MENORRHAGIA/ENLARGED UTERUS ADENOMYOSIS     Surgeon(s) and Role:  * Malcolm Metro Alt, DO - Primary  * Antionette Poles, DO - Assisting     Anesthesia: General     Procedure:  Procedure(s):  HYSTERECTOMY TOTAL LAPAROSCOPIC WITH BILATERAL SALPINGECTOMIES     Findings: uterus with adenomyotic appearance 8-9 week. Purple implant on left fallopian tube with left ovary displaced high on pelvic brim. Normal right tube and ovary. Normal liver edge. During cystoscopy, both ureteral jets were seen and no suture.            Additional Notes     Orders Placed This Encounter     INITIAL PHYSICIAN ORDER: OBSERVATION/OUTPATIENT IN A BED Surgical     Standing Status: Standing     Number of Occurrences: 1     Order Specific Question: Patient Class:     Answer: Observation [104]     Order Specific Question: Type of Bed     Answer: Surgical [18]     Order Specific Question: Reason for Observation     Answer: Laproscopic hysterectomy     Order Specific Question: Admitting Physician     Answer: ALT, Malcolm Metro [[id number]]     Order Specific Question: Attending Physician     Answer: Roosvelt Maser [[id number]]     Order Specific Question: Diagnosis  Answer: History of DVT (deep vein thrombosis)     Current facility-administered medications: rivaroxaban (XARELTO) tablet 20 mg, 20 mg, Oral, DAILY WITH BREAKFAST, Brandi Kay Alt, DO, 20 mg at 03/26/14 [id number]; sodium chloride (NS) flush 5-10 mL, 5-10 mL, IntraVENous, Q8H, Brandi Kay Alt, DO, Stopped at 03/26/14 0600; sodium chloride (NS) flush 5-10 mL, 5-10 mL, IntraVENous, PRN, Brandi Kay Alt, DO, 10 mL at 03/26/14 0303     [START ON 03/27/2014] ibuprofen (MOTRIN) tablet 800 mg, 800 mg, Oral, Q6H PRN, Malcolm MetroBrandi Kay Alt, DO; ketorolac (TORADOL) injection 30 mg, 30 mg, IntraVENous, Q6H, Brandi Kay Alt, DO, 30 mg at 03/26/14 0733; ondansetron (ZOFRAN) injection 4 mg, 4 mg, IntraVENous, Q4H PRN, Brandi Kay Alt, DO; promethazine (PHENERGAN) injection 12.5 mg, 12.5 mg, IntraVENous, Q6H PRN, Brandi Kay Alt, DO     diphenhydrAMINE (BENADRYL) injection 12.5 mg, 12.5 mg, IntraVENous, Q4H PRN, Brandi Kay Alt, DO; zolpidem (AMBIEN) tablet 5 mg, 5 mg, Oral, QHS PRN, Brandi Kay Alt, DO; oxyCODONE-acetaminophen (PERCOCET 7.5) 7.5-325 mg per tablet 1 Tab, 1 Tab, Oral, Q4H PRN, Malcolm MetroBrandi Kay Alt, DO, 1 Tab at 03/25/14 1816; HYDROmorphone (PF) (DILAUDID) injection 1 mg, 1 mg, IntraVENous, Q4H PRN, Malcolm MetroBrandi Kay Alt, DO     Medications Discontinued During This Encounter     lidocaine (XYLOCAINE) 10 mg/mL (1 %)*     Reason: Patient Transfer     lidocaine-prilocaine (EMLA) 2.5-2.5 *     Reason: Patient Transfer     lactated ringers infusion      Reason: Patient Transfer     sodium chloride (NS) flush 5-10 mL      Reason: Patient Transfer     sodium chloride (NS) flush 5-10 mL      Reason: Patient Transfer     famotidine (PF) (PEPCID) injection 2*     Reason: Patient Transfer     fentaNYL citrate (PF) injection 100 *     Reason: Patient Transfer     lactated ringers infusion      Reason: Patient Transfer     sodium chloride (NS) flush 5-10 mL      Reason: Patient Transfer     HYDROcodone-acetaminophen (NORCO) 5-*     Reason: Patient Transfer      oxyCODONE-acetaminophen (PERCOCET) 5*     Reason: Patient Transfer     fentaNYL citrate (PF) injection 50 m*     Reason: Patient Transfer     dexamethasone (DECADRON) 4 mg/mL inj*     Reason: Patient Transfer     ondansetron (ZOFRAN) injection 4 mg      Reason: Patient Transfer     promethazine (PHENERGAN) injection 1*     Reason: Patient Transfer     diphenhydrAMINE (BENADRYL) injection*     Reason: Patient Transfer     lactated ringers infusion      Reason: Other     TRANSFER PATIENT     INITIAL PHYSICIAN ORDER: OBSERVATION/OUTPATIENT IN A BED     UP AD LIB     NOTIFY PROVIDER: VITAL SIGNS CHANGES     FOLEY CATH, DISCONTINUE     INTAKE AND OUTPUT     INCENTIVE SPIROMETRY     APPLY/MAINTAIN SEQUENTIAL COMPRESSION DEVICE     CALL MD     ACTIVITY AFTER DISCHARGE     SALINE LOCK IV     DIET REGULAR     DIET REGULAR     FOLLOW UP VISIT     DISCHARGE PATIENT     FOLLOW UP  VISIT

## 2014-03-25 NOTE — Progress Notes (Signed)
Am assessment completed. Pt is alert and oriented x 3, lungs clear, breathing non-labored. Bowel sounds + q 4 denies constipation. Continent of bowel and bladder - foley in place draining yellow urine to be removed at 1600. Pt drowsy but answers questions appropriately. 3P sites c/d/i. Dry peri-pad. Pt has a hx of dvt. Pleasant and cooperative. Is having pain in abd will get medication.

## 2014-03-25 NOTE — Progress Notes (Signed)
Pt ambulating in hallway with husband. Fluids discontinued and pt hepwelled.

## 2014-03-25 NOTE — Progress Notes (Signed)
PM assessment complete. Alert, oriented x 4. Respirations even and unlabored, no distress noted. Bowel sounds hypoactive. Patient voiding without difficulty. Denies needs or pain. Family at bedside. Aware to call should needs arise. Will continue to monitor.

## 2014-03-25 NOTE — Op Note (Signed)
Laparoscopic Hysterectomy Operative Report    Patient: Marissa LanChristina H Kissel MRN: [id number]  SSN: [id number]    Date of Birth: 17-May-1976  Age: 38 y.o.  Sex: female      Date of Surgery: 03/25/2014     Preoperative Diagnosis: MENORRHAGIA/ENLARGED UTERUS ADENOMYOSIS     Postoperative Diagnosis: MENORRHAGIA/ENLARGED UTERUS ADENOMYOSIS     Surgeon(s) and Role:     * Malcolm MetroBrandi Kay Raheem Kolbe, DO - Primary     * Antionette PolesStephanie Dach, DO - Assisting     Anesthesia: General     Procedure:  Procedure(s):  HYSTERECTOMY TOTAL LAPAROSCOPIC WITH BILATERAL SALPINGECTOMIES     Findings: uterus with adenomyotic appearance 8-9 week.  Purple implant on left fallopian tube with left ovary displaced high on pelvic brim.  Normal right tube and ovary.  Normal liver edge.  During cystoscopy, both ureteral jets were seen and no suture.     Estimated Blood Loss:    50cc    Drains: foley  Fluids:  1300cc    Urine Output:  525cc    Implants:  * No implants in log *     Specimens:    ID Type Source Tests Collected by Time Destination   1 : Uterus with Bilateral Fallopian Tubes Fresh Uterus with Bilateral Fallopian Tubes  Malcolm MetroBrandi Kay Eliu Batch, DO 03/25/2014 0816 Pathology         DVT Prophylaxis: scds    Antibiotic Prophylaxis: ancef    Procedure in Detail:  Patient was taken to the operating room where she was administered geta.  Once her anesthesia was found to be adequate, she was prepped and draped in normal sterile fashion with arms tucked.  Next v-care uterine manipulator was placed around external cervical os and balloon was inflated.  Next attention was turned to the umbilicus.  A 5mm skin incision was made and Veress needle was inserted with clear aspiration of fluid.  Next Co2 gas was connected with opening pressure noted to be 7 mmHg.  Co2 gas was instilled until abdominal was insufflated.  Patient was placed in steep trendelenburg.    Next a 10mm right port site was placed under direct visualization. Next  5mm port was placed in left lower quadrant under direct visualization.    Next attention was turned to left round ligament and left uteroovarian and these were taken down with the harmonic scalpel.  The harmonic was used to take down the cardinals and the uterine artery pedicle with excellent hemostasis.  Next bladder flap was dissected. Next the right adnexa was taken down in similar fashion to left.  At this time, the harmonic scalpel was used to dissect the vaginal cuff at the apex of the v-care cup.  Next the uterus was pulled into the vaginal vault.  Next both fallopian tubes were removed with great hemostasis.   The cuff was sutured using 0-vicryl in figure of 8 and interrupted fashion with excellent hemostasis.    Next the pelvic was irrigated and excellent hemostasis was insured.   Cystoscopy was performed.  All ports were removed under direct visualization.  The skin site over the right lower quadrant site was sutured with 4-0 vicryl.  Dermabond was placed over all sites. Pt tolerated with procedure well and was taken to recovery in standard fashion.      Signed By: Roosvelt MaserBRANDI KAY Inmer Nix, DO     March 25, 2014

## 2014-03-25 NOTE — Op Note (Signed)
Laparoscopic Hysterectomy Operative Report    Patient: Marissa Perry MRN: [id number]  SSN: [id number]    Date of Birth: September 02, 1976  Age: [age] y.o.  Sex: female      Date of Surgery: 03/25/2014     Preoperative Diagnosis: MENORRHAGIA/ENLARGED UTERUS ADENOMYOSIS     Postoperative Diagnosis: MENORRHAGIA/ENLARGED UTERUS ADENOMYOSIS     Surgeon(s) and Role:     * Malcolm MetroBrandi Kay Edrik Rundle, DO - Primary     * Antionette PolesStephanie Dach, DO - Assisting     Anesthesia: General     Procedure:  Procedure(s):  HYSTERECTOMY TOTAL LAPAROSCOPIC WITH BILATERAL SALPINGECTOMIES     Findings: uterus with adenomyotic appearance 8-9 week.  Purple implant on left fallopian tube with left ovary displaced high on pelvic brim.  Normal right tube and ovary.  Normal liver edge.  During cystoscopy, both ureteral jets were seen and no suture.     Estimated Blood Loss:    50cc    Drains: foley  Fluids:  1300cc    Urine Output:  525cc    Implants:  * No implants in log *     Specimens:    ID Type Source Tests Collected by Time Destination   1 : Uterus with Bilateral Fallopian Tubes Fresh Uterus with Bilateral Fallopian Tubes  Malcolm MetroBrandi Kay Justus Duerr, DO 03/25/2014 0816 Pathology         DVT Prophylaxis: scds    Antibiotic Prophylaxis: ancef    Procedure in Detail:  Patient was taken to the operating room where she was administered geta.  Once her anesthesia was found to be adequate, she was prepped and draped in normal sterile fashion with arms tucked.  Next v-care uterine manipulator was placed around external cervical os and balloon was inflated.  Next attention was turned to the umbilicus.  A 5mm skin incision was made and Veress needle was inserted with clear aspiration of fluid.  Next Co2 gas was connected with opening pressure noted to be 7 mmHg.  Co2 gas was instilled until abdominal was insufflated.  Patient was placed in steep trendelenburg.    Next a 10mm right port site was placed under direct visualization. Next 5mm port was placed in left lower quadrant under  direct visualization.    Next attention was turned to left round ligament and left uteroovarian and these were taken down with the harmonic scalpel.  The harmonic was used to take down the cardinals and the uterine artery pedicle with excellent hemostasis.  Next bladder flap was dissected. Next the right adnexa was taken down in similar fashion to left.  At this time, the harmonic scalpel was used to dissect the vaginal cuff at the apex of the v-care cup.  Next the uterus was pulled into the vaginal vault.  Next both fallopian tubes were removed with great hemostasis.   The cuff was sutured using 0-vicryl in figure of 8 and interrupted fashion with excellent hemostasis.    Next the pelvic was irrigated and excellent hemostasis was insured.   Cystoscopy was performed.  All ports were removed under direct visualization.  The skin site over the right lower quadrant site was sutured with 4-0 vicryl.  Dermabond was placed over all sites. Pt tolerated with procedure well and was taken to recovery in standard fashion.      Signed By: Roosvelt MaserBRANDI KAY Cathey Fredenburg, DO     March 25, 2014

## 2014-03-26 LAB — CBC WITH AUTOMATED DIFF
ABS. BASOPHILS: 0 10*3/uL (ref 0.0–0.2)
ABS. EOSINOPHILS: 0 10*3/uL (ref 0.0–0.8)
ABS. IMM. GRANS.: 0 10*3/uL (ref 0.0–0.5)
ABS. LYMPHOCYTES: 3 10*3/uL (ref 0.5–4.6)
ABS. MONOCYTES: 1.3 10*3/uL (ref 0.1–1.3)
ABS. NEUTROPHILS: 10.3 10*3/uL — ABNORMAL HIGH (ref 1.7–8.2)
BASOPHILS: 0 % (ref 0.0–2.0)
EOSINOPHILS: 0 % — ABNORMAL LOW (ref 0.5–7.8)
HCT: 32.9 % — ABNORMAL LOW (ref 35.8–46.3)
HGB: 10.3 g/dL — ABNORMAL LOW (ref 11.7–15.4)
IMMATURE GRANULOCYTES: 0.2 % (ref 0.0–5.0)
LYMPHOCYTES: 20 % (ref 13–44)
MCH: 27.7 PG (ref 26.1–32.9)
MCHC: 31.3 g/dL — ABNORMAL LOW (ref 31.4–35.0)
MCV: 88.4 FL (ref 79.6–97.8)
MONOCYTES: 9 % (ref 4.0–12.0)
MPV: 9.8 FL — ABNORMAL LOW (ref 10.8–14.1)
NEUTROPHILS: 71 % (ref 43–78)
PLATELET: 260 10*3/uL (ref 150–450)
RBC: 3.72 M/uL — ABNORMAL LOW (ref 4.05–5.25)
RDW: 13.7 % (ref 11.9–14.6)
WBC: 14.7 10*3/uL — ABNORMAL HIGH (ref 4.3–11.1)

## 2014-03-26 LAB — METABOLIC PANEL, BASIC
Anion gap: 7 mmol/L (ref 7–16)
BUN: 8 MG/DL (ref 6–23)
CO2: 27 mmol/L (ref 21–32)
Calcium: 8.7 MG/DL (ref 8.3–10.4)
Chloride: 106 mmol/L (ref 98–107)
Creatinine: 1 MG/DL (ref 0.6–1.0)
GFR est AA: >60 mL/min/{1.73_m2} (ref >60)
GFR est non-AA: >60 mL/min/{1.73_m2} (ref >60)
Glucose: 97 mg/dL (ref 65–100)
Potassium: 3.8 mmol/L (ref 3.5–5.1)
Sodium: 140 mmol/L (ref 136–145)

## 2014-03-26 MED ORDER — HYDROCODONE-ACETAMINOPHEN 7.5 MG-325 MG TAB
ORAL_TABLET | Freq: Four times a day (QID) | ORAL | Status: DC | PRN
Start: 2014-03-26 — End: 2014-03-30

## 2014-03-26 MED ORDER — IBUPROFEN 800 MG TAB
800 mg | ORAL_TABLET | Freq: Three times a day (TID) | ORAL | Status: DC | PRN
Start: 2014-03-26 — End: 2016-11-16

## 2014-03-26 MED FILL — KETOROLAC TROMETHAMINE 30 MG/ML INJECTION: 30 mg/mL (1 mL) | INTRAMUSCULAR | Qty: 1

## 2014-03-26 MED FILL — XARELTO 20 MG TABLET: 20 mg | ORAL | Qty: 1

## 2014-03-26 NOTE — Progress Notes (Signed)
OBG/GYN Progress Note    Patient doing well post-op day1 from Procedure(s):  HYSTERECTOMY TOTAL LAPAROSCOPIC WITH BILATERAL SALPINGECTOMIES without significant complaints.  Pain controlled on current medication.  Voiding without difficulty. Patient is passing Flatus.    Vitals:  Blood pressure 100/56, pulse 66, temperature 98.3 ??F (36.8 ??C), resp. rate 18, height 5\' 3"  (1.6 m), weight 87.544 kg (193 lb), last menstrual period 03/01/2014, SpO2 100 %, not currently breastfeeding.  Temp (24hrs), Avg:97.7 ??F (36.5 ??C), Min:96.9 ??F (36.1 ??C), Max:98.4 ??F (36.9 ??C)        Exam:  Patient without distress.  Heart rrr  Lungs cta b&s                Abdomen soft,  nontender.                 Incision dry and clean without erythema.               Lower extremities are negative for swelling, cords or tenderness.    Labs:   Recent Results (from the past 24 hour(s))   PROTHROMBIN TIME + INR    Collection Time: 03/25/14  2:33 PM   Result Value Ref Range    Prothrombin time 10.3 9.6 - 12.0 sec    INR 1.0 0.9 - 1.2     METABOLIC PANEL, BASIC    Collection Time: 03/26/14  5:12 AM   Result Value Ref Range    Sodium 140 136 - 145 mmol/L    Potassium 3.8 3.5 - 5.1 mmol/L    Chloride 106 98 - 107 mmol/L    CO2 27 21 - 32 mmol/L    Anion gap 7 7 - 16 mmol/L    Glucose 97 65 - 100 mg/dL    BUN 8 6 - 23 MG/DL    Creatinine 1.611.00 0.6 - 1.0 MG/DL    GFR est AA >09>60 >60>60 AV/WUJ/8.11B1ml/min/1.73m2    GFR est non-AA >60 >60 ml/min/1.2073m2    Calcium 8.7 8.3 - 10.4 MG/DL   CBC WITH AUTOMATED DIFF    Collection Time: 03/26/14  5:12 AM   Result Value Ref Range    WBC 14.7 (H) 4.3 - 11.1 K/uL    RBC 3.72 (L) 4.05 - 5.25 M/uL    HGB 10.3 (L) 11.7 - 15.4 g/dL    HCT 47.832.9 (L) 29.535.8 - 46.3 %    MCV 88.4 79.6 - 97.8 FL    MCH 27.7 26.1 - 32.9 PG    MCHC 31.3 (L) 31.4 - 35.0 g/dL    RDW 62.113.7 30.811.9 - 65.714.6 %    PLATELET 260 150 - 450 K/uL    MPV 9.8 (L) 10.8 - 14.1 FL    DF AUTOMATED      NEUTROPHILS 71 43 - 78 %    LYMPHOCYTES 20 13 - 44 %     MONOCYTES 9 4.0 - 12.0 %    EOSINOPHILS 0 (L) 0.5 - 7.8 %    BASOPHILS 0 0.0 - 2.0 %    IMMATURE GRANULOCYTES 0.2 0.0 - 5.0 %    ABS. NEUTROPHILS 10.3 (H) 1.7 - 8.2 K/UL    ABS. LYMPHOCYTES 3.0 0.5 - 4.6 K/UL    ABS. MONOCYTES 1.3 0.1 - 1.3 K/UL    ABS. EOSINOPHILS 0.0 0.0 - 0.8 K/UL    ABS. BASOPHILS 0.0 0.0 - 0.2 K/UL    ABS. IMM. GRANS. 0.0 0.0 - 0.5 K/UL       Assessment and Plan:  Patient appears to be having uncomplicated post Procedure(s):  HYSTERECTOMY TOTAL LAPAROSCOPIC WITH BILATERAL SALPINGECTOMIES course.  Continue routine post-op care.  Pt with hx of clot and did receive xarelto this am.  Will discharge home.

## 2014-03-26 NOTE — Progress Notes (Signed)
Bedside report received from outgoing nurse, Lavell IslamKim Davis RN.

## 2014-03-26 NOTE — Discharge Summary (Signed)
Discharge Summary     Name: Zoe LanChristina H Altland MRN: [id number]  SSN: [id number]    Date of Birth: 11-13-1975  Age: 38 y.o.  Sex: female      Admit Date: 03/25/2014    Discharge Date: 03/26/2014      Admitting Physician: Malcolm MetroBrandi Kay Myan Suit, DO     * Admission Diagnoses: Abnormal uterine bleeding and Adenomyosis    * Discharge Diagnoses:   Hospital Problems as of 03/26/2014 Date Reviewed: 03/23/2014        ICD-9-CM Class Noted - Resolved POA    Abnormal uterine bleeding (AUB) 626.9  03/23/2014 - Present Yes        * (Principal)History of DVT (deep vein thrombosis) V12.51  03/23/2014 - Present No               * Procedures: Total Laparoscopic Hysterectomy with Bilateral Salpingectomy    * Discharge Condition: Good    [facility]* Hospital Course: Normal hospital course for this procedure.    Significant Diagnostic Studies:   Recent Results (from the past 24 hour(s))   PROTHROMBIN TIME + INR    Collection Time: 03/25/14  2:33 PM   Result Value Ref Range    Prothrombin time 10.3 9.6 - 12.0 sec    INR 1.0 0.9 - 1.2     METABOLIC PANEL, BASIC    Collection Time: 03/26/14  5:12 AM   Result Value Ref Range    Sodium 140 136 - 145 mmol/L    Potassium 3.8 3.5 - 5.1 mmol/L    Chloride 106 98 - 107 mmol/L    CO2 27 21 - 32 mmol/L    Anion gap 7 7 - 16 mmol/L    Glucose 97 65 - 100 mg/dL    BUN 8 6 - 23 MG/DL    Creatinine 7.421.00 0.6 - 1.0 MG/DL    GFR est AA >59>60 >56>60 LO/VFI/4.33I9ml/min/1.73m2    GFR est non-AA >60 >60 ml/min/1.7373m2    Calcium 8.7 8.3 - 10.4 MG/DL   CBC WITH AUTOMATED DIFF    Collection Time: 03/26/14  5:12 AM   Result Value Ref Range    WBC 14.7 (H) 4.3 - 11.1 K/uL    RBC 3.72 (L) 4.05 - 5.25 M/uL    HGB 10.3 (L) 11.7 - 15.4 g/dL    HCT 51.832.9 (L) 84.135.8 - 46.3 %    MCV 88.4 79.6 - 97.8 FL    MCH 27.7 26.1 - 32.9 PG    MCHC 31.3 (L) 31.4 - 35.0 g/dL    RDW 66.013.7 63.011.9 - 16.014.6 %    PLATELET 260 150 - 450 K/uL    MPV 9.8 (L) 10.8 - 14.1 FL    DF AUTOMATED      NEUTROPHILS 71 43 - 78 %    LYMPHOCYTES 20 13 - 44 %    MONOCYTES 9 4.0 - 12.0 %     EOSINOPHILS 0 (L) 0.5 - 7.8 %    BASOPHILS 0 0.0 - 2.0 %    IMMATURE GRANULOCYTES 0.2 0.0 - 5.0 %    ABS. NEUTROPHILS 10.3 (H) 1.7 - 8.2 K/UL    ABS. LYMPHOCYTES 3.0 0.5 - 4.6 K/UL    ABS. MONOCYTES 1.3 0.1 - 1.3 K/UL    ABS. EOSINOPHILS 0.0 0.0 - 0.8 K/UL    ABS. BASOPHILS 0.0 0.0 - 0.2 K/UL    ABS. IMM. GRANS. 0.0 0.0 - 0.5 K/UL       * Disposition: Home  Discharge Medications:   Current Discharge Medication List      START taking these medications    Details   ibuprofen (MOTRIN) 800 mg tablet Take 1 Tab by mouth every eight (8) hours as needed.  Qty: 35 Tab, Refills: 1      HYDROcodone-acetaminophen (NORCO) 7.5-325 mg per tablet Take 1 Tab by mouth every six (6) hours as needed for Pain. Max Daily Amount: 4 Tabs.  Qty: 30 Tab, Refills: 0         CONTINUE these medications which have NOT CHANGED    Details   rivaroxaban (XARELTO) 20 mg tab tablet Take 20 mg by mouth daily. Stop taking 3 days prior to surgery per Anesthesia guidelines. Last dose 03/21/14   Indications: DEEP VEIN THROMBOSIS PREVENTION              * Follow-up Care/Patient Instructions:  Activity: No sex, douching, or tampons for 6 weeks or as directed by your physician. No heavy lifting for 6 weeks. No driving while taking pain medication.  Diet: Resume pre-hospital diet  Wound Care: As directed    Follow-up Information    Follow up With Details Comments Contact Info    Wandalee FerdinandMatthew P Roehrs, MD   [address]    [facility]  [place] [id number]  [phone]             Signed By:  Roosvelt MaserBRANDI KAY Donavon Kimrey, DO     March 26, 2014

## 2014-03-26 NOTE — Progress Notes (Signed)
Patient states passing flatus. Rates abdominal pain 2/10. Scheduled Toradol 30 mg IV given. Will monitor.

## 2014-03-26 NOTE — Progress Notes (Signed)
Prescription and discharge instructions both verbal and written given to patient.?? Patient verbalized understanding of instructions.?? No home medications or valuables to be returned.?? Patient instructed to call for assistance when ready for discharge home.?? ??

## 2014-03-30 LAB — CBC W/O DIFF
HCT: 39.3 % (ref 35.8–46.3)
HGB: 12.6 g/dL (ref 11.7–15.4)
MCH: 28.3 PG (ref 26.1–32.9)
MCHC: 32.1 g/dL (ref 31.4–35.0)
MCV: 88.3 FL (ref 79.6–97.8)
MPV: 9.8 FL — ABNORMAL LOW (ref 10.8–14.1)
PLATELET: 358 10*3/uL (ref 150–450)
RBC: 4.45 M/uL (ref 4.05–5.25)
RDW: 13.5 % (ref 11.9–14.6)
WBC: 13.3 10*3/uL — ABNORMAL HIGH (ref 4.3–11.1)

## 2014-03-30 LAB — METABOLIC PANEL, BASIC
Anion gap: 11 mmol/L (ref 7–16)
BUN: 12 MG/DL (ref 6–23)
CO2: 26 mmol/L (ref 21–32)
Calcium: 9.5 MG/DL (ref 8.3–10.4)
Chloride: 103 mmol/L (ref 98–107)
Creatinine: 0.7 MG/DL (ref 0.6–1.0)
GFR est AA: >60 mL/min/{1.73_m2} (ref >60)
GFR est non-AA: >60 mL/min/{1.73_m2} (ref >60)
Glucose: 99 mg/dL (ref 65–100)
Potassium: 5 mmol/L (ref 3.5–5.1)
Sodium: 140 mmol/L (ref 136–145)

## 2014-03-30 LAB — LACTIC ACID: Lactic acid: 2 MMOL/L (ref 0.4–2.0)

## 2014-03-30 MED ORDER — LORAZEPAM 2 MG/ML IJ SOLN
2 mg/mL | INTRAMUSCULAR | Status: AC
Start: 2014-03-30 — End: 2014-03-30
  Administered 2014-03-30: 12:00:00 via INTRAVENOUS

## 2014-03-30 MED ORDER — SODIUM CHLORIDE 0.9% BOLUS IV
0.9 % | Freq: Once | INTRAVENOUS | Status: AC
Start: 2014-03-30 — End: 2014-03-30
  Administered 2014-03-30: 12:00:00 via INTRAVENOUS

## 2014-03-30 MED FILL — LORAZEPAM 2 MG/ML IJ SOLN: 2 mg/mL | INTRAMUSCULAR | Qty: 1

## 2014-03-30 NOTE — ED Notes (Signed)
S/p hysterectomy 03/25/14. C/o "not feeling right" after "waking up in a panic"  around 2:30 am this morning. She states that she just feels like she is on fire on the inside. Husband states that she had a similar episode on Saturday which resolved. Pt is shivering but she states that she does that "when I get nervous". Denies pain at this time.

## 2014-03-30 NOTE — ED Provider Notes (Addendum)
HPI Comments: Marissa Perry is a 38 year old female who underwent laparoscopic-assisted hysterectomy last Thursday.  Over the weekend she has had several episodes of feeling hot and flushed all over her body.  She says is very difficult to explain.  Last several minutes and then will resolve on its own.    This morning it was worse she felt heat coming from her body, felt flushed     She denies any chest pain, shortness of breath, nausea, vomiting.  No documented fevers    She does have a history of DVT in the right leg, she states that the swelling in her leg has not changed.  She is taking her xarelto as directed.  She has not had any chest pain shortness of breath reported dyspnea.       Past Medical History   Diagnosis Date   ??? Anxiety      no present treatment    ??? Thromboembolus (HCC) 9/14     LLE; pt takes xarelto daily   ??? Menorrhagia    ??? Enlarged uterus    ??? Adenomyosis    ??? Abnormal uterine bleeding (AUB) 03/23/2014   ??? History of DVT (deep vein thrombosis) 03/23/2014   ??? Unspecified adverse effect of anesthesia      has woken up after anesthesia "violent" in the past; pt states just due stressfull situations   ??? Psychiatric disorder         Past Surgical History   Procedure Laterality Date   ??? Hx cervical fusion  09/2012 at [facility]     C6 - C7 with hardware; Dr. Kirstie PeriBucci   ??? Hx endoscopy  12/2013   ??? Hx colonoscopy  12/2013   ??? Hx wisdom teeth extraction     ??? Hx cesarean section  1993   ??? Hx dilation and curettage       due to miscarriage         Family History   Problem Relation Age of Onset   ??? Hypertension Father         History     Social History   ??? Marital Status: MARRIED     Spouse Name: N/A     Number of Children: N/A   ??? Years of Education: N/A     Occupational History   ??? Not on file.     Social History Main Topics   ??? Smoking status: Former Smoker -- 0.50 packs/day for 6 years   ??? Smokeless tobacco: Never Used      Comment: stopped in 2013   ??? Alcohol Use: Yes      Comment: Rare   ??? Drug Use: Yes      Special: Marijuana      Comment: occasional use of marijuana   ??? Sexual Activity: Not on file     Other Topics Concern   ??? Not on file     Social History Narrative                  ALLERGIES: Review of patient's allergies indicates no known allergies.      Review of Systems   Constitutional: Negative for fever and chills.   HENT: Negative.    Eyes: Negative.    Respiratory: Negative.  Negative for chest tightness, shortness of breath and wheezing.    Cardiovascular: Negative for chest pain, palpitations and leg swelling.   Gastrointestinal: Negative for nausea, vomiting, abdominal pain, constipation and blood in stool.   Neurological: Negative.  Psychiatric/Behavioral: Positive for decreased concentration. The patient is nervous/anxious.        Filed Vitals:    03/30/14 0715   BP: 119/60   Pulse: 77   Temp: 97.7 ??F (36.5 ??C)   Resp: 18   Height: 5\' 3"  (1.6 m)   Weight: 88.225 kg (194 lb 8 oz)   SpO2: 100%            Physical Exam   Constitutional: She is oriented to person, place, and time. She appears well-developed and well-nourished.   HENT:   Head: Normocephalic and atraumatic.   Eyes: EOM are normal. Pupils are equal, round, and reactive to light.   Neck: Normal range of motion.   Cardiovascular: Normal rate and regular rhythm.    Pulmonary/Chest: Breath sounds normal. No respiratory distress. She has no wheezes.   Abdominal: Soft. She exhibits no distension. There is no tenderness.   Musculoskeletal: Normal range of motion. She exhibits no edema or tenderness.   Neurological: She is alert and oriented to person, place, and time.   Skin: Skin is warm and dry. No rash noted.   Psychiatric: She has a normal mood and affect.   Nursing note and vitals reviewed.       MDM  Number of Diagnoses or Management Options  Diagnosis management comments: With the exception of traumatic teeth chattering the patient has a normal medical exam.  She is not hypoxic  tachycardic tachypnea.  She has no leg pain or leg swelling.  Nothing to indicate a PE.  She is not complaining of chest pain.    She is afebrile    We'll check a white count as well as her hemoglobin-she is on several tonus been taking Motrin 800 mg.  Could these be a allergic reaction to medications or anesthesia?      850-recheck.  Patient appears improved the teeth chattering has stopped, she reports that the flushing sensation has resolved.  There is still no chest pain shortness of breath palpitations tachycardia or tachypnea.  I do not think this is the result of a PE, or any cardiac issue.  The husband further states that after her previous surgery she was given an opiate pain medication and had a similar response.  She does report now that she had some queasiness and felt poorly again I wonder if this is related to a allergy to her pain medication.  Both times that it has happened is related to taking Lortab.  We'll stop the Lortab and have her continue on alternative pain medication and follow up with Dr. Steva Ready        Procedures

## 2014-03-30 NOTE — ED Notes (Signed)
2 prior IV attempts per Algernon Hux[place], [place] were unsuccessful.

## 2014-03-30 NOTE — ED Notes (Signed)
I have reviewed discharge instructions with the patient.  Printed instructions given. The patient verbalized understanding. Left ambulatory without difficulty.

## 2014-03-30 NOTE — ED Notes (Signed)
Pt up to the bathroom without problem.

## 2014-03-30 NOTE — ED Notes (Signed)
Pt reports feeling more relaxed. Husband remains at the bedside. Will continue to monitor.

## 2014-03-30 NOTE — ED Notes (Signed)
Pt resting with eyes closed. Rouses easily. States that she feels better at this time. Husband at the bedside.

## 2014-04-23 LAB — AMB POC URINALYSIS DIP STICK AUTO W/ MICRO (PGU)
Bilirubin (UA POC): NEGATIVE
Blood (UA POC): NEGATIVE
Glucose (UA POC): NEGATIVE mg/dL
Ketones (UA POC): NEGATIVE
Leukocyte esterase (UA POC): NEGATIVE
Nitrites (UA POC): NEGATIVE
Protein (UA POC): NEGATIVE
Specific gravity (UA POC): 1.015 (ref 1.001–1.035)
Urobilinogen (POC): 0.2
pH (UA POC): 5.5 (ref 4.6–8.0)

## 2014-04-23 MED ORDER — SOLIFENACIN 5 MG TAB
5 mg | ORAL_TABLET | Freq: Every day | ORAL | Status: DC
Start: 2014-04-23 — End: 2014-04-27

## 2014-04-23 NOTE — Telephone Encounter (Signed)
Pt is calling and states that her insurance will not cover the vesicare. Pt would like to know if there is a generic brand she can have. Please call pt.

## 2014-04-23 NOTE — Progress Notes (Signed)
[facility]  [address]  [place], [place] [address]  [phone]    Marissa Perry  DOB: [birth year]    Chief Complaint   Patient presents with   ??? Overactive Bladder     New Patient          HPI     Marissa Perry is a 38 y.o. female who presents to office for evaluation of OAB referred by Dr Merry Proud Alt.  Patient c/o 2 year history of urinary frequency/urgency.  She had LAVH with bilateral salpingectomies.    Patient c/o worsening frequency/urgency since hysterectomy.  Daytime frequency is q45 minutes if she drinks any fluids.  Nocturia 5-8x/nights.  She notes coffee and OJ increases her urinary frequency.  She notes bladder discomfort d/t significant urgency to void with full bladder which is relieved with voiding.  Admits to postvoid dribble.     Normal BM daily.  She is sexually active and c/o pain with intercourse prior to hysterectomy.  G5P4.      Patient is Xarelto for history of DVT.           Past Medical History   Diagnosis Date   ??? Anxiety      no present treatment    ??? Thromboembolus (HCC) 9/14     LLE; pt takes xarelto daily   ??? Menorrhagia    ??? Enlarged uterus    ??? Adenomyosis    ??? Abnormal uterine bleeding (AUB) 03/23/2014   ??? History of DVT (deep vein thrombosis) 03/23/2014   ??? Unspecified adverse effect of anesthesia      has woken up after anesthesia "violent" in the past; pt states just due stressfull situations   ??? Psychiatric disorder      Past Surgical History   Procedure Laterality Date   ??? Hx cervical fusion  09/2012 at [facility]     C6 - C7 with hardware; Dr. Kirstie Peri   ??? Hx endoscopy  12/2013   ??? Hx colonoscopy  12/2013   ??? Hx wisdom teeth extraction     ??? Hx cesarean section  1993   ??? Hx dilation and curettage       due to miscarriage     Current Outpatient Prescriptions   Medication Sig Dispense Refill   ??? solifenacin (VESICARE) 5 mg tablet Take 1 Tab by mouth daily. 30 Tab 12   ??? ibuprofen (MOTRIN) 800 mg tablet Take 1 Tab by mouth every eight (8) hours as needed. 35 Tab 1    ??? rivaroxaban (XARELTO) 20 mg tab tablet Take 20 mg by mouth daily. Stop taking 3 days prior to surgery per Anesthesia guidelines. Last dose 03/21/14   Indications: DEEP VEIN THROMBOSIS PREVENTION       Allergies   Allergen Reactions   ??? Codeine Nausea Only     History     Social History   ??? Marital Status: MARRIED     Spouse Name: N/A     Number of Children: N/A   ??? Years of Education: N/A     Occupational History   ??? Not on file.     Social History Main Topics   ??? Smoking status: Former Smoker -- 0.50 packs/day for 6 years   ??? Smokeless tobacco: Never Used      Comment: stopped in 2013   ??? Alcohol Use: Yes      Comment: Rare   ??? Drug Use: Yes     Special: Marijuana  Comment: occasional use of marijuana   ??? Sexual Activity: Not on file     Other Topics Concern   ??? Not on file     Social History Narrative     Family History   Problem Relation Age of Onset   ??? Hypertension Father        Review of Systems  Constitutional: Positive for headaches.  Skin: Negative skin ROS  Eyes: Eyes negative  Cardiovascular: Neg cardio ROS  GI: Neg GI ROS  Genitourinary: Positive for nocturia, urgency, frequent urination, incomplete emptying and hysterectomy.Number of pregnancies: 5.  Number of births: 4.  Neurological: Neg neuro ROS  Psychological: Neg psych ROS  Endocrine: Positive for fatigue.  Hem/Lymphatic:        Blood clotting problems      Urinalysis  UA - Dipstick  Results for orders placed or performed in visit on 04/23/14   AMB POC URINALYSIS DIP STICK AUTO W/ MICRO (PGU)     Status: None   Result Value Ref Range Status    Glucose (UA POC) Negative Negative mg/dL Final    Bilirubin (UA POC) Negative Negative Final    Ketones (UA POC) Negative Negative Final    Specific gravity (UA POC) 1.015 1.001 - 1.035 Final    Blood (UA POC) Negative Negative Final    pH (UA POC) 5.5 4.6 - 8.0 Final    Protein (UA POC) Negative Negative Final    Urobilinogen (POC) 0.2 mg/dL  Final    Nitrites (UA POC) Negative Negative Final     Leukocyte esterase (UA POC) Negative Negative Final       UA - Micro  WBC - 0  RBC - 0  Bacteria - 0  Epith - 0    PHYSICAL EXAM    BP 126/77 mmHg   Pulse 81   Temp(Src) 98.7 ??F (37.1 ??C) (Tympanic)   Ht 5\' 3"  (1.6 m)   Wt 194 lb 6.4 oz (88.179 kg)   BMI 34.44 kg/m2   LMP 03/01/2014 (Exact Date)    General appearance - alert, well appearing, and in no distress  Mental status - alert, oriented to person, place, and time  Eyes - Bilaterally normal & regular  Nose - normal and patent, no erythema, discharge   Neck - supple, no significant adenopathy  Chest/Lung-  Quiet, even and easy respiratory effort without use of accessory muscles  Abdomen - soft, nontender, nondistended, no masses or organomegaly  Back exam - full range of motion, no tenderness  Neurological - alert, oriented, normal speech, no focal findings or movement disorder noted on gross visual exam  Musculoskeletal - normal gait and station  Extremities - normal full range of motion of all extremities  Skin - normal coloration and turgor, no rashes    Female Genitourinary    External Genitalia  Urethra: Characteristics- Normal with no masses, tenderness or scarring  Vulva: Characteristics- Normal, Lesions- None    Speculum & Bimanual  Vagina:  Vaginal Wall: Enterocele- Negative,  Rectocele- Negative  Cystocele- Negative  Vaginal Lesions- None   Vaginal Mucosa- Normal  Cervix: Characteristics- surgically absent  Uterus: Characteristics- Surgically absent  Adnexa: Characteristics- Non-palpable  Bladder- Normal without fullness, masses, or tenderness  Rectal - normal rectal tone, no mass        PLAN  Discussed OAB and lifestyle modification.  Bladder Matters given and reviewed with patient  Also discussed possible IC diagnosis--will hold on diagnosis of IC until we see response of Vesicare.  However, may need cysto/hydrodistention in near future.  Erxd Vesicare 5mg  po daily  RTO in 6 weeks for follow up      Assessment and Plan    ICD-9-CM ICD-10-CM     1. Overactive bladder 596.51 N32.81 AMB POC URINALYSIS DIP STICK AUTO W/ MICRO (PGU)      solifenacin (VESICARE) 5 mg tablet      INSERT,NON-INDWELLING BLADDER CATHETER           Lucky Cowboy, WHNP-BC  Time Spent with patient:  25 Minutes (>50% of this time was spent on counseling this patient about OAB)  Supervising Physician: Dr Katrinka Blazing

## 2014-04-26 NOTE — Telephone Encounter (Signed)
Spoke with patient and advised that we need her to call the ins co and find out what would be covered.  I explained each ins co is different and we need to know exactly which one would be covered. She will call us back with the information.

## 2014-04-27 MED ORDER — OXYBUTYNIN CHLORIDE SR 5 MG 24 HR TAB
5 mg | ORAL_TABLET | Freq: Every day | ORAL | Status: DC
Start: 2014-04-27 — End: 2014-05-18

## 2014-04-27 NOTE — Telephone Encounter (Signed)
What oxybutynin do you want to send for her

## 2014-04-27 NOTE — Telephone Encounter (Signed)
I Erxd Oxybutynin XL  po daily

## 2014-04-27 NOTE — Telephone Encounter (Signed)
Cld and spoke with patient and advised that the medication was rx'd to the pharmacy

## 2014-04-27 NOTE — Telephone Encounter (Signed)
Pt is calling and states that her insurance will cover is the oxybutyn. Please call pt.

## 2014-04-30 ENCOUNTER — Telehealth

## 2014-04-30 NOTE — Telephone Encounter (Signed)
Rec'd fax from [facility] stating that a PA is needed for the oxybutynin 5 mg tablet because [employer] says it is not covered.  [employer] number is [phone] and plan member id #[id number].

## 2014-05-03 NOTE — Telephone Encounter (Signed)
Was on hold for 15 min and no one answered.  Will try again

## 2014-05-04 NOTE — Telephone Encounter (Signed)
Pt calling stating that ocybutynin needs a PA. Please call.

## 2014-05-07 NOTE — Telephone Encounter (Signed)
Called for PA form

## 2014-05-12 NOTE — Telephone Encounter (Signed)
Faxed form back on 05/12/2014.  Will wait for decision from ins co.

## 2014-05-18 MED ORDER — TROSPIUM SR 60 MG 24 HR CAP
60 mg | ORAL_CAPSULE | Freq: Every day | ORAL | Status: DC
Start: 2014-05-18 — End: 2016-01-17

## 2014-05-18 NOTE — Telephone Encounter (Signed)
I ERxd sanctura XR 

## 2014-05-18 NOTE — Telephone Encounter (Signed)
Spoke with patient and advised her that the ins co would not pay for the Vesicare nor would they pay for oxybutynin.  Therefore, NP sent over Sanctura for her to try.  Patient will pick up and start taking.

## 2014-05-18 NOTE — Telephone Encounter (Signed)
Rec'd fax from [place] and they are stating that the oxybutynin Chloride ER was not approved do to the "Exclusion" section of the patients health plan.  It states that she has to have a failure of Oxybutynin or Sanctura and there is documented failure of Vesicare and Myrbetriq.  So they note that she did have a trial of Vesicare.  What would you like the patient to try?

## 2014-06-07 ENCOUNTER — Ambulatory Visit
Admit: 2014-06-07 | Discharge: 2014-06-07 | Payer: PRIVATE HEALTH INSURANCE | Attending: Women's Health | Primary: Family Medicine

## 2014-06-07 DIAGNOSIS — N3281 Overactive bladder: Secondary | ICD-10-CM

## 2014-06-07 LAB — AMB POC URINALYSIS DIP STICK AUTO W/ MICRO (PGU)
Bilirubin (UA POC): NEGATIVE
Blood (UA POC): NEGATIVE
Glucose (UA POC): NEGATIVE mg/dL
Ketones (UA POC): NEGATIVE
Leukocyte esterase (UA POC): NEGATIVE
Nitrites (UA POC): NEGATIVE
Protein (UA POC): NEGATIVE
Specific gravity (UA POC): 1.02 (ref 1.001–1.035)
Urobilinogen (POC): 0.2
pH (UA POC): 7 (ref 4.6–8.0)

## 2014-06-07 MED ORDER — SOLIFENACIN 5 MG TAB
5 mg | ORAL_TABLET | Freq: Every day | ORAL | Status: DC
Start: 2014-06-07 — End: 2016-01-17

## 2014-06-07 NOTE — Progress Notes (Signed)
[facility]  [address]  [place], [place] [id number]  [phone]    Marissa Perry  DOB: Dec 30, 1975    Chief Complaint   Patient presents with   ??? Incontinence          HPI     Marissa Perry is a 38 y.o. female who presents to office for 6 week f/u on OAB. Patient's insurance would NOT cover Oxybutynin or Vesicare until she failed ReunionSanctura. She has been on Sanctura XR 60mg  po daily without significant improvement.  Nocturia still 4x/night on Sanctura.  Denies incontinence. Philis NettleSanctura is also causing intolerable constipation and blurred vision. She trialed Vesicare 5mg  po daily (samples) with significant improvement with nocturia down to 1x/night.     Patient was initially seen on 04/23/14 for evaluation of OAB referred by Dr Merry ProudBrandi Alt. Patient c/o 2 year history of urinary frequency/urgency. She had LAVH with bilateral salpingectomies.   Patient c/o worsening frequency/urgency since hysterectomy. Daytime frequency is q45 minutes if she drinks any fluids. Nocturia was 5-8x/nights. She notes coffee and OJ increases her urinary frequency. She notes bladder discomfort d/t significant urgency to void with full bladder which is relieved with voiding. Admits to postvoid dribble.     Normal BM daily.  She is sexually active and c/o pain with intercourse prior to hysterectomy.  G5P4.     Patient is Xarelto for history of DVT.           Past Medical History   Diagnosis Date   ??? Anxiety      no present treatment    ??? Thromboembolus (HCC) 9/14     LLE; pt takes xarelto daily   ??? Menorrhagia    ??? Enlarged uterus    ??? Adenomyosis    ??? Abnormal uterine bleeding (AUB) 03/23/2014   ??? History of DVT (deep vein thrombosis) 03/23/2014   ??? Unspecified adverse effect of anesthesia      has woken up after anesthesia "violent" in the past; pt states just due stressfull situations   ??? Psychiatric disorder      Past Surgical History   Procedure Laterality Date   ??? [facility]      C6 - C7 with hardware; Dr. Kirstie PeriBucci   ??? Hx endoscopy  12/2013   ??? Hx colonoscopy  12/2013   ??? Hx wisdom teeth extraction     ??? Hx cesarean section  1993   ??? Hx dilation and curettage       due to miscarriage     Current Outpatient Prescriptions   Medication Sig Dispense Refill   ??? warfarin (COUMADIN) 5 mg tablet Take 5 mg by mouth daily.     ??? solifenacin (VESICARE) 5 mg tablet Take 1 Tab by mouth daily. 30 Tab 12   ??? trospium (SANCTURA XL) 60 mg capsule Take 1 Cap by mouth Daily (before breakfast). 30 Cap 12   ??? ibuprofen (MOTRIN) 800 mg tablet Take 1 Tab by mouth every eight (8) hours as needed. 35 Tab 1     Allergies   Allergen Reactions   ??? Codeine Nausea Only     History     Social History   ??? Marital Status: MARRIED     Spouse Name: N/A     Number of Children: N/A   ??? Years of Education: N/A     Occupational History   ??? Not on file.     Social History Main Topics   ???  Smoking status: Former Smoker -- 0.50 packs/day for 6 years   ??? Smokeless tobacco: Never Used      Comment: stopped in 2013   ??? Alcohol Use: Yes      Comment: Rare   ??? Drug Use: Yes     Special: Marijuana      Comment: occasional use of marijuana   ??? Sexual Activity: Not on file     Other Topics Concern   ??? Not on file     Social History Narrative     Family History   Problem Relation Age of Onset   ??? Hypertension Father        Review of Systems  Constitutional:   Negative for fever and chills.  Genitourinary: Positive for nocturia. Negative for urgency, leakage w/ urge and frequent urination.  Musculoskeletal:  Negative for back pain and arthralgias.      Urinalysis  UA - Dipstick  Results for orders placed or performed in visit on 06/07/14   AMB POC URINALYSIS DIP STICK AUTO W/ MICRO (PGU)     Status: None   Result Value Ref Range Status    Glucose (UA POC) Negative Negative mg/dL Final    Bilirubin (UA POC) Negative Negative Final    Ketones (UA POC) Negative Negative Final    Specific gravity (UA POC) 1.020 1.001 - 1.035 Final     Blood (UA POC) Negative Negative Final    pH (UA POC) 7.0 4.6 - 8.0 Final    Protein (UA POC) Negative Negative Final    Urobilinogen (POC) 0.2 mg/dL  Final    Nitrites (UA POC) Negative Negative Final    Leukocyte esterase (UA POC) Negative Negative Final       UA - Micro  WBC - 0  RBC - 0  Bacteria - 0  Epith - 0    PHYSICAL EXAM    Filed Vitals:    06/07/14 0957   BP: 118/58   Pulse: 81   Temp: 98.2 ??F (36.8 ??C)   TempSrc: Tympanic   Height: 5\' 3"  (1.6 m)   Weight: 195 lb 3.2 oz (88.542 kg)       General appearance - alert, well appearing, and in no distress  Mental status - alert, oriented to person, place, and time  Eyes - Bilaterally normal & regular  Nose - normal and patent, no erythema, discharge   Neck - supple, no significant adenopathy  Chest/Lung-  Quiet, even and easy respiratory effort without use of accessory muscles  Abdomen - soft, nontender, nondistended, no masses or organomegaly  Back exam - full range of motion, no tenderness  Neurological - alert, oriented, normal speech, no focal findings or movement disorder noted on gross visual exam  Musculoskeletal - normal gait and station  Extremities - normal full range of motion of all extremities  Skin - normal coloration and turgor, no rashes        PLAN  Discontinue Sanctura  Re--ERxd Vesicare 5mg  po daily to see if we can get it covered now that she has failed Reunion.  Assist with Vesicare 5mg  samples prn  RTO in 1year           Assessment and Plan    ICD-10-CM ICD-9-CM    1. Overactive bladder N32.81 596.51 AMB POC URINALYSIS DIP STICK AUTO W/ MICRO (PGU)      solifenacin (VESICARE) 5 mg tablet             Lucky Cowboy, WHNP-BC  Time Spent with patient:  20 Minutes (>50% of this time was spent on counseling this patient about OAB)  Supervising Physician: Dr Katrinka Blazing

## 2014-06-13 IMAGING — CT CT ABD-PELV W/O CM
1 series · 14 of 17 positions shown, 20 images · non-contrast
Comparison: None.

CLINICAL DATA: Left-sided abdominal pain, nausea.

CT ABDOMEN AND PELVIS WITHOUT CONTRAST
TECHNIQUE: Multidetector CT imaging of the abdomen and pelvis was
performed following the standard protocol without intravenous
contrast.

[Series 6: lung · axial · 0.74mm/px · z∈[+1586,+1656]mm · 14 of 17 slices shown, 20 images]
[im 2/17  soft-tissue]
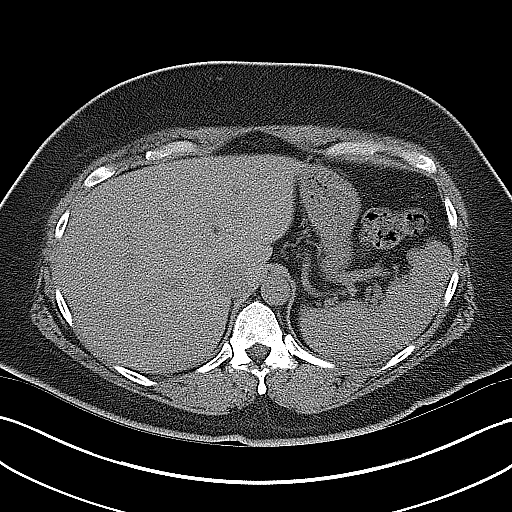
[im 2/17  bone]
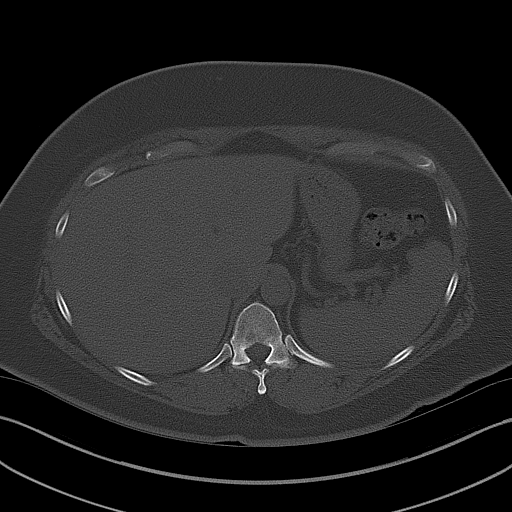
[im 3/17  soft-tissue]
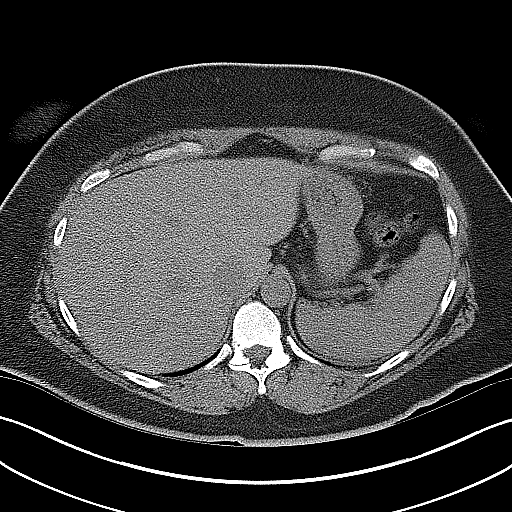
[im 4/17  soft-tissue]
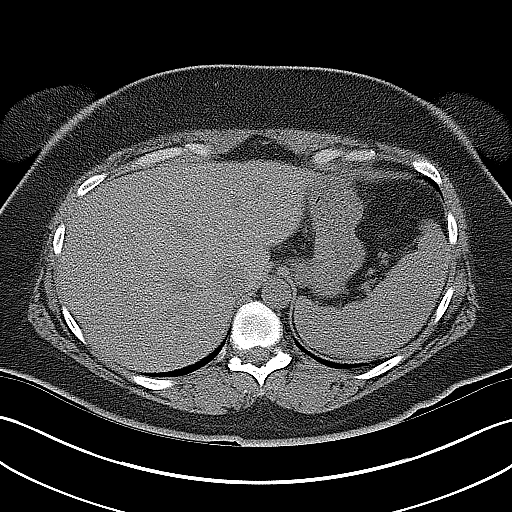
[im 5/17  soft-tissue]
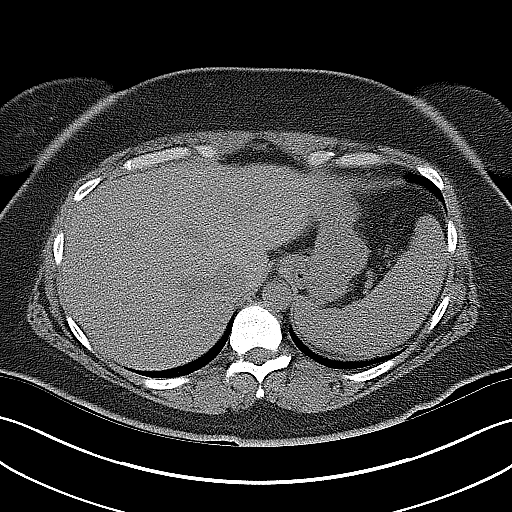
[im 6/17  soft-tissue]
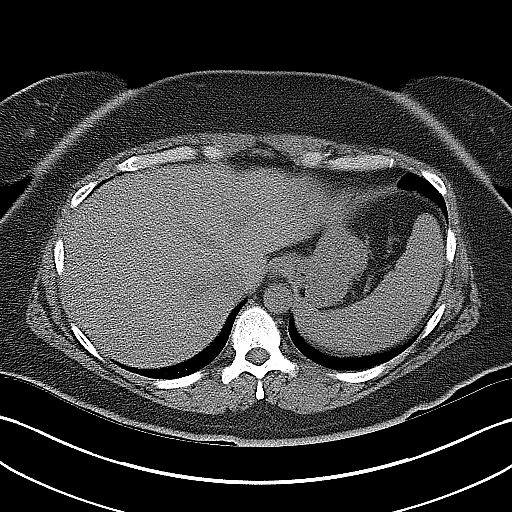
[im 7/17  soft-tissue]
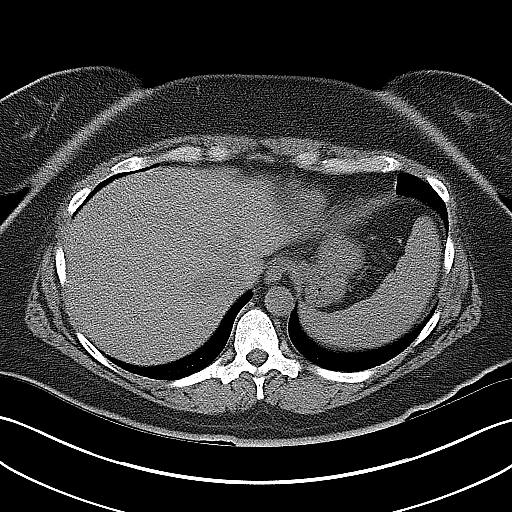
[im 8/17  soft-tissue]
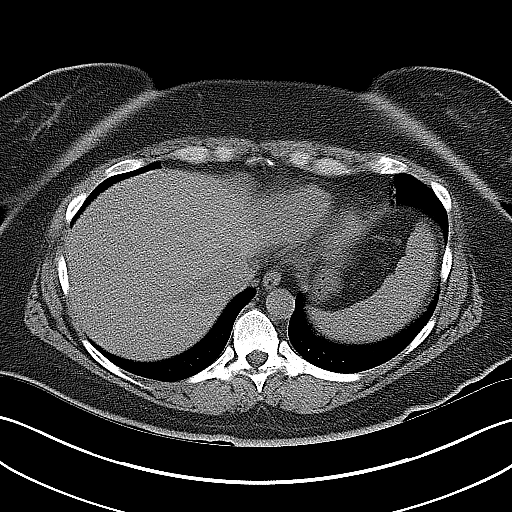
[im 10/17  soft-tissue]
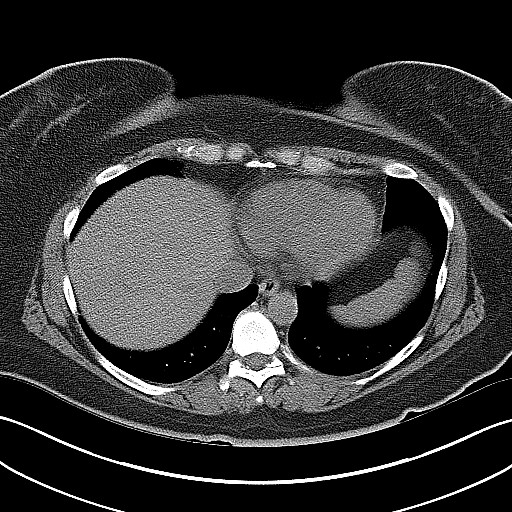
[im 11/17  soft-tissue]
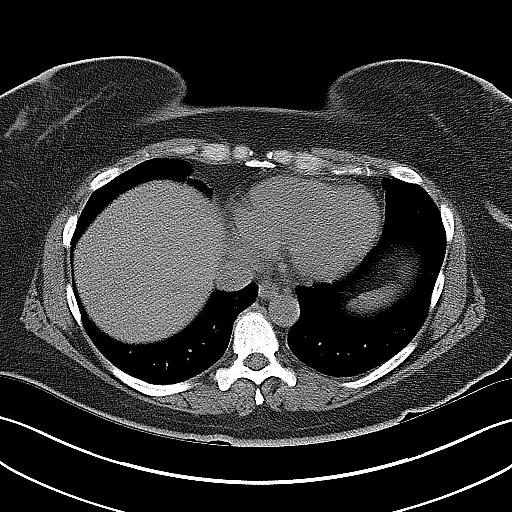
[im 11/17  bone]
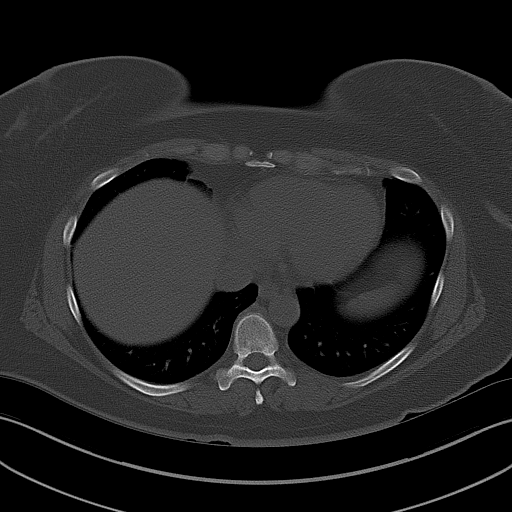
[im 12/17  soft-tissue]
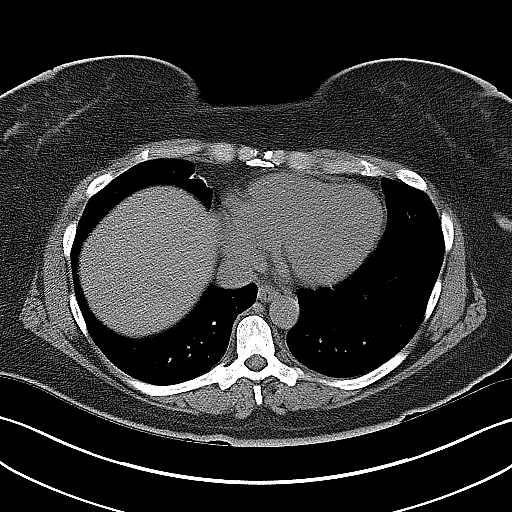
[im 13/17  soft-tissue]
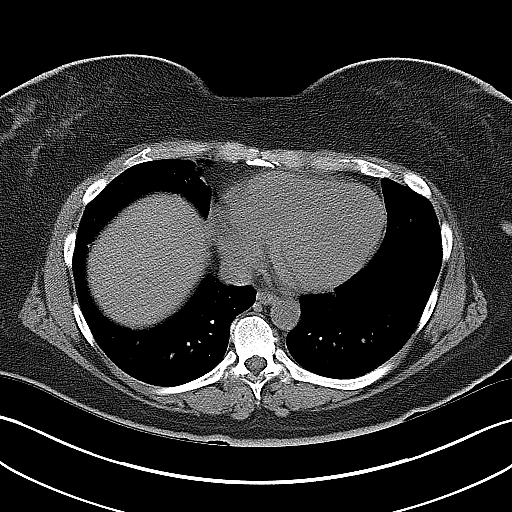
[im 13/17  lung]
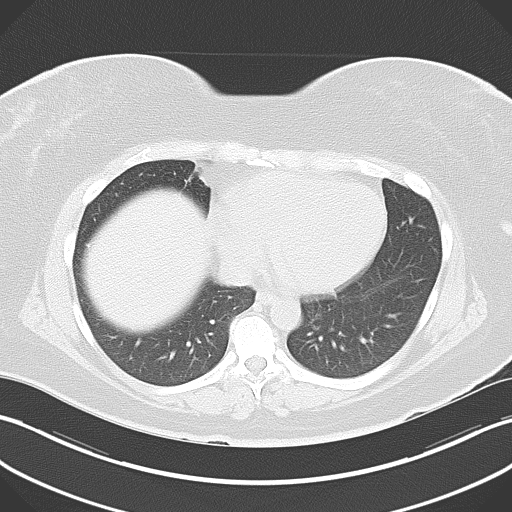
[im 14/17  soft-tissue]
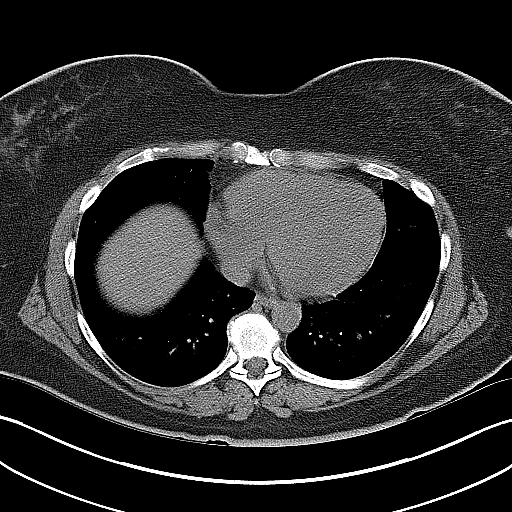
[im 14/17  lung]
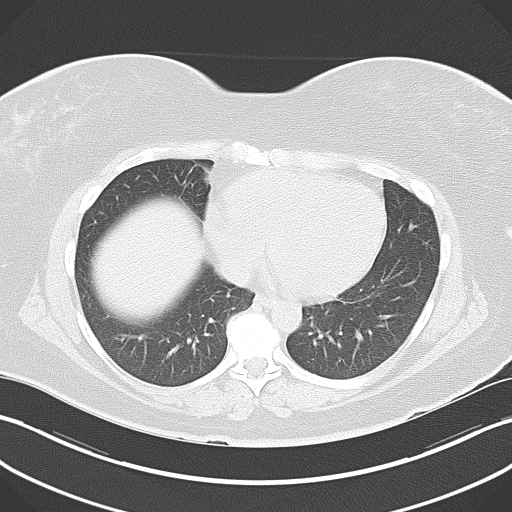
[im 15/17  soft-tissue]
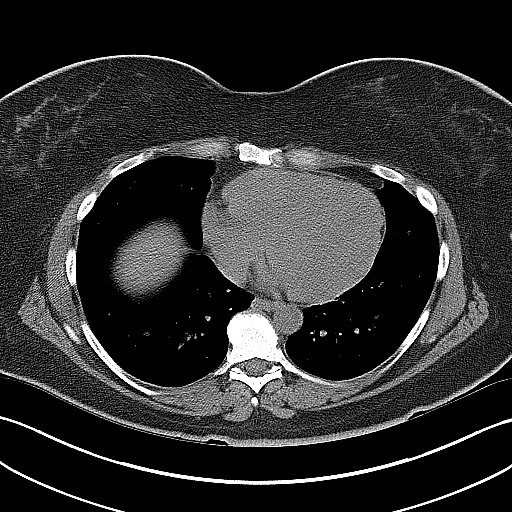
[im 15/17  lung]
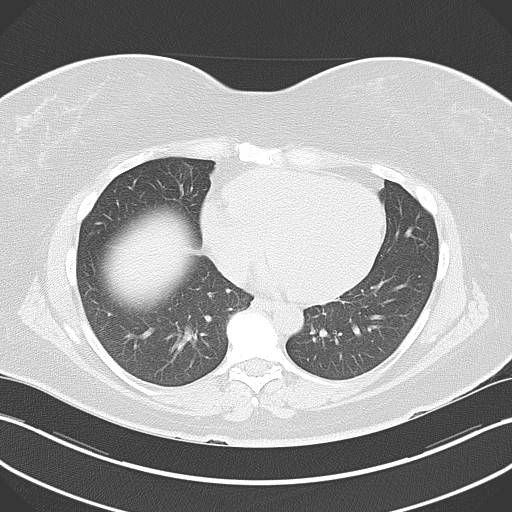
[im 16/17  soft-tissue]
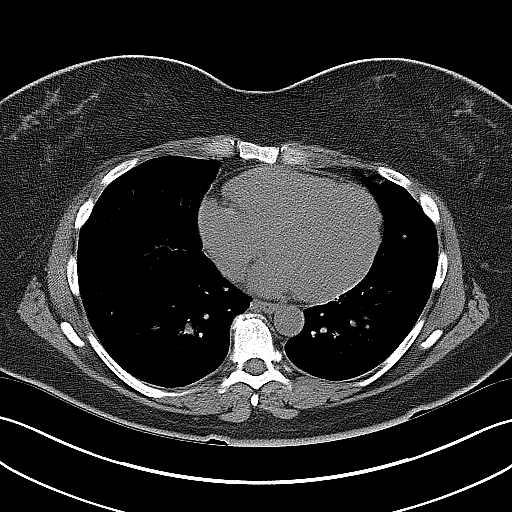
[im 16/17  lung]
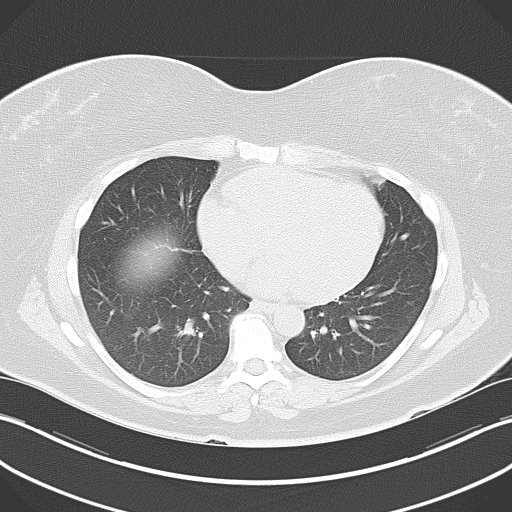

[14 of 17 positions shown; findings below may reference images not displayed]

FINDINGS: Visualized lung bases appear normal.  No focal
abnormalities seen involving the liver, spleen or pancreas.  No
gallstones are noted.  Adrenal glands appear normal. Nonobstructive
calculus is seen in mid pole collecting system of right kidney.
Mild left hydronephrosis is noted secondary to a 4 mm calculus in
proximal left ureter.  The appendix appears normal.  No gross bowel
abnormality is noted.  Urinary bladder and uterus appear normal.
No abnormal fluid collection or significant adenopathy is noted.
IMPRESSION: Small nonobstructive right renal calculus.  Mild left
hydronephrosis secondary to a 4 mm proximal left ureteral calculus.

## 2014-09-08 ENCOUNTER — Inpatient Hospital Stay: Admit: 2014-09-08 | Primary: Family Medicine

## 2014-09-08 ENCOUNTER — Encounter

## 2014-09-08 DIAGNOSIS — Z0289 Encounter for other administrative examinations: Secondary | ICD-10-CM

## 2015-01-12 ENCOUNTER — Encounter

## 2015-01-13 ENCOUNTER — Inpatient Hospital Stay: Admit: 2015-01-13 | Payer: BLUE CROSS/BLUE SHIELD | Attending: Obstetrics & Gynecology | Primary: Family Medicine

## 2015-01-13 DIAGNOSIS — Z1231 Encounter for screening mammogram for malignant neoplasm of breast: Secondary | ICD-10-CM

## 2015-05-03 ENCOUNTER — Inpatient Hospital Stay: Admit: 2015-05-03 | Payer: BLUE CROSS/BLUE SHIELD | Primary: Family Medicine

## 2015-05-03 ENCOUNTER — Encounter

## 2015-05-03 DIAGNOSIS — M5020 Other cervical disc displacement, unspecified cervical region: Secondary | ICD-10-CM

## 2015-06-08 ENCOUNTER — Encounter: Payer: PRIVATE HEALTH INSURANCE | Attending: Women's Health | Primary: Family Medicine

## 2016-01-17 ENCOUNTER — Ambulatory Visit
Admit: 2016-01-17 | Discharge: 2016-01-17 | Payer: BLUE CROSS/BLUE SHIELD | Attending: Obstetrics & Gynecology | Primary: Family Medicine

## 2016-01-17 ENCOUNTER — Emergency Department (HOSPITAL_COMMUNITY)
Admission: EM | Admit: 2016-01-17 | Discharge: 2016-01-17 | Disposition: A | Discharge status: Home / Self Care | Payer: Self-pay | Attending: Emergency Medicine | Admitting: Emergency Medicine

## 2016-01-17 ENCOUNTER — Emergency Department (HOSPITAL_COMMUNITY): Payer: Self-pay

## 2016-01-17 ENCOUNTER — Encounter (HOSPITAL_COMMUNITY): Payer: Self-pay | Admitting: Emergency Medicine

## 2016-01-17 DIAGNOSIS — M1712 Unilateral primary osteoarthritis, left knee: Secondary | ICD-10-CM | POA: Insufficient documentation

## 2016-01-17 DIAGNOSIS — Z7982 Long term (current) use of aspirin: Secondary | ICD-10-CM | POA: Insufficient documentation

## 2016-01-17 DIAGNOSIS — Z79899 Other long term (current) drug therapy: Secondary | ICD-10-CM | POA: Insufficient documentation

## 2016-01-17 DIAGNOSIS — R079 Chest pain, unspecified: Secondary | ICD-10-CM

## 2016-01-17 DIAGNOSIS — R0781 Pleurodynia: Secondary | ICD-10-CM | POA: Insufficient documentation

## 2016-01-17 DIAGNOSIS — Z01419 Encounter for gynecological examination (general) (routine) without abnormal findings: Secondary | ICD-10-CM

## 2016-01-17 LAB — AMB POC URINALYSIS DIP STICK AUTO W/O MICRO
Bilirubin (UA POC): NEGATIVE
Blood (UA POC): NEGATIVE
Glucose (UA POC): NEGATIVE
Ketones (UA POC): NEGATIVE
Leukocyte esterase (UA POC): NEGATIVE
Nitrites (UA POC): NEGATIVE
Protein (UA POC): NEGATIVE mg/dL
Specific gravity (UA POC): 1.02 (ref 1.001–1.035)
Urobilinogen (UA POC): 0.2 (ref 0.2–1)
pH (UA POC): 7 (ref 4.6–8.0)

## 2016-01-17 LAB — AMB POC TOTAL HEMOGLOBIN (SPHB): Total Hemoglobin (SpHb)(POC): 11.4 g/dL

## 2016-01-17 LAB — BASIC METABOLIC PANEL
Anion gap: 5 (ref 5–15)
BUN: 16 mg/dL (ref 6–20)
CO2: 27 mmol/L (ref 22–32)
Calcium: 9.3 mg/dL (ref 8.9–10.3)
Chloride: 105 mmol/L (ref 101–111)
Creatinine, Ser: 0.78 mg/dL (ref 0.44–1.00)
GFR calc Af Amer: >60 mL/min (ref >60)
GFR calc non Af Amer: >60 mL/min (ref >60)
Glucose, Bld: 106 mg/dL — ABNORMAL HIGH (ref 65–99)
Potassium: 3.7 mmol/L (ref 3.5–5.1)
Sodium: 137 mmol/L (ref 135–145)

## 2016-01-17 LAB — CBC
HCT: 36.6 % (ref 36.0–46.0)
Hemoglobin: 12.3 g/dL (ref 12.0–15.0)
MCH: 31 pg (ref 26.0–34.0)
MCHC: 33.6 g/dL (ref 30.0–36.0)
MCV: 92.2 fL (ref 78.0–100.0)
Platelets: 345 10*3/uL (ref 150–400)
RBC: 3.97 MIL/uL (ref 3.87–5.11)
RDW: 13.1 % (ref 11.5–15.5)
WBC: 11.5 10*3/uL — ABNORMAL HIGH (ref 4.0–10.5)

## 2016-01-17 LAB — I-STAT TROPONIN, ED: Troponin i, poc: 0 ng/mL (ref 0.00–0.08)

## 2016-01-17 MED ORDER — CLINDAMYCIN 1 % TOPICAL GEL
1 % | Freq: Two times a day (BID) | CUTANEOUS | 2 refills | Status: DC
Start: 2016-01-17 — End: 2016-11-16

## 2016-01-17 MED ORDER — FLUCONAZOLE 150 MG TAB
150 mg | ORAL_TABLET | ORAL | 4 refills | Status: DC
Start: 2016-01-17 — End: 2016-11-16

## 2016-01-17 NOTE — Progress Notes (Signed)
HPI:  Marissa Perry is a 40 y.o. female G5 P0000 who is here today for a well woman exam. She requests a refill of Diflucan to have on hand and Clindamycin 1% gel.  She does not think she is having any active vaginitis issues now.         Date Performed Result   PAP 01/12/15 neg   Mammogram 01/13/15 benign   Colonoscopy 12/2013    Dexa na         OB History     Gravida Para Term Preterm AB TAB SAB Ectopic Multiple Living    5 4        4         GYN History     Patient's last menstrual period was 03/01/2014 (exact date). She is status post hysterectomy.        Past Medical History:   Diagnosis Date   ??? Abnormal uterine bleeding (AUB) 03/23/2014   ??? Adenomyosis    ??? Anxiety     no present treatment    ??? Enlarged uterus    ??? History of DVT (deep vein thrombosis) 03/23/2014   ??? Menorrhagia    ??? Psychiatric disorder    ??? 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 Unspecified adverse effect of anesthesia     has woken up after anesthesia "violent" in the past; pt states just due stressfull situations     Past Surgical History:   Procedure Laterality Date   ??? HX CERVICAL FUSION  09/2012 at [facility]    C6 - C7 with hardware; Dr. Kirstie Peri   ??? HX CESAREAN SECTION  1993   ??? HX COLONOSCOPY  12/2013   ??? HX DILATION AND CURETTAGE      due to miscarriage   ??? HX ENDOSCOPY  12/2013   ??? HX WISDOM TEETH EXTRACTION         Allergies   Allergen Reactions   ??? Codeine Nausea Only       Current Outpatient Prescriptions   Medication Sig Dispense Refill   ??? RIVAROXABAN (XARELTO PO) Take  by mouth.     ??? fluconazole (DIFLUCAN) 100 mg tablet Take 200 mg by mouth daily. FDA advises cautious prescribing of oral fluconazole in pregnancy.     ??? clindamycin (CLINDAGEL) 1 % topical gel Apply  to affected area two (2) times a day. use thin film on affected area     ??? PHENTERMINE HCL (ADIPEX-P PO) Take  by mouth.     ??? ibuprofen (MOTRIN) 800 mg tablet Take 1 Tab by mouth every eight (8) hours as needed. 35 Tab 1         Social History      Social History   ??? Marital status: MARRIED - 4 years      Spouse name: N/A   ??? Number of children: N/A   ??? Years of education: N/A     Occupational History   ??? Not on file.     Social History Main Topics   ??? Smoking status: Former Smoker     Packs/day: 0.50     Years: 6.00   ??? Smokeless tobacco: Never Used      Comment: stopped in 2013   ??? Alcohol use Yes      Comment: Rare   ??? Drug use: Yes     Special: Marijuana      Comment: occasional use of marijuana   ??? Sexual activity: Yes  Partners: Male     Birth control/ protection: Surgical     Other Topics Concern   ??? Not on file     Social History Narrative     Family History   Problem Relation Age of Onset   ??? Hypertension Father    ??? Hypertension Mother    ??? Breast Cancer Maternal Grandmother 60   ??? Heart Disease Maternal Grandmother    ??? Hypertension Maternal Grandmother    ??? Diabetes Paternal Grandmother    ??? Heart Disease Paternal Grandmother    ??? Hypertension Paternal Grandmother        Review of Systems   Constitutional: Negative for chills and fever.   HENT: Negative for congestion.    Eyes: Negative for blurred vision and double vision.   Respiratory: Negative for shortness of breath.    Cardiovascular: Negative for chest pain.   Gastrointestinal: Negative for constipation, diarrhea, nausea and vomiting.   Genitourinary: Negative for dysuria.   Musculoskeletal: Negative for joint pain.   Skin: Negative for rash.   Neurological: Negative for dizziness, loss of consciousness and headaches.   Psychiatric/Behavioral: Negative for depression. The patient is not nervous/anxious.         Visit Vitals   ??? BP 108/64   ??? Pulse 91   ??? Wt 189 lb (85.7 kg)   ??? LMP 03/01/2014 (Exact Date)   ??? SpO2 100%   ??? BMI 33.48 kg/m2      Results for orders placed or performed in visit on 01/17/16   AMB POC URINALYSIS DIP STICK AUTO W/O MICRO     Status: None   Result Value Ref Range Status    Color (UA POC) Yellow  Final    Clarity (UA POC) Clear  Final     Glucose (UA POC) Negative Negative Final    Bilirubin (UA POC) Negative Negative Final    Ketones (UA POC) Negative Negative Final    Specific gravity (UA POC) 1.020 1.001 - 1.035 Final    Blood (UA POC) Negative Negative Final    pH (UA POC) 7.0 4.6 - 8.0 Final    Protein (UA POC) Negative Negative mg/dL Final    Urobilinogen (UA POC) 0.2 mg/dL 0.2 - 1 Final    Nitrites (UA POC) Negative Negative Final    Leukocyte esterase (UA POC) Negative Negative Final   Results for orders placed or performed in visit on 06/07/14   AMB POC URINALYSIS DIP STICK AUTO W/ MICRO (PGU)     Status: None   Result Value Ref Range Status    Glucose (UA POC) Negative Negative mg/dL Final    Bilirubin (UA POC) Negative Negative Final    Ketones (UA POC) Negative Negative Final    Specific gravity (UA POC) 1.020 1.001 - 1.035 Final    Blood (UA POC) Negative Negative Final    pH (UA POC) 7.0 4.6 - 8.0 Final    Protein (UA POC) Negative Negative Final    Urobilinogen (POC) 0.2 mg/dL  Final    Nitrites (UA POC) Negative Negative Final    Leukocyte esterase (UA POC) Negative Negative Final       Physical Exam   Constitutional: She is oriented to person, place, and time. Vital signs are normal. She appears well-developed and well-nourished. No distress.   HENT:   Head: Normocephalic and atraumatic.   Eyes: Conjunctivae are normal. Right eye exhibits no discharge. Left eye exhibits no discharge.   Neck: Normal range of motion. Neck  supple. No thyromegaly present.   Cardiovascular: Normal rate and regular rhythm.  Exam reveals no gallop and no friction rub.    No murmur heard.  Pulmonary/Chest: Effort normal and breath sounds normal. No respiratory distress. She has no wheezes. She has no rales. Right breast exhibits no inverted nipple, no mass, no nipple discharge, no skin change and no tenderness. Left breast exhibits no inverted nipple, no mass, no nipple discharge, no skin change and no tenderness.    Abdominal: Soft. Bowel sounds are normal. She exhibits no distension and no mass. There is no tenderness. There is no rebound and no guarding.   Genitourinary: Vagina normal. No breast swelling, tenderness, discharge or bleeding. There is no rash or tenderness on the right labia. There is no rash or tenderness on the left labia. Right adnexum displays no mass. Left adnexum displays no mass. No foreign body in the vagina.   Musculoskeletal: Normal range of motion.   Neurological: She is alert and oriented to person, place, and time. Coordination normal.   Skin: Skin is warm and dry. She is not diaphoretic.   Psychiatric: She has a normal mood and affect. Her behavior is normal. Judgment and thought content normal.   Vitals reviewed.      Assessment/Plan  Trula OreChristina was seen today for well woman.    Diagnoses and all orders for this visit:    Well woman exam  -     Cancel: IGP, Aptima HPV, Reflex 16/18, 45 ([id number])  -     Bilateral (Routine screening, yearly, no symptoms); Future  -     Transcutaneous HgB ([id number])  -     Urinalysis, Auto w/o Micro (81003)  -     IGP, Aptima HPV, Reflex 16/18, 45 ([id number])    Screening for iron deficiency anemia  -     Transcutaneous HgB ([id number])    Screening for genitourinary condition  -     Urinalysis, Auto w/o Micro (81003)    Visit for screening mammogram  -     Bilateral (Routine screening, yearly, no symptoms); Future    Screening for human papillomavirus  -     Cancel: IGP, Aptima HPV, Reflex 16/18, 45 ([id number])  -     IGP, Aptima HPV, Reflex 16/18, 45 ([id number])         Follow-up Disposition:  Return in about 1 year (around 01/16/2017), or if symptoms worsen or fail to improve.    Hellon Vaccarella D.O

## 2016-01-17 NOTE — ED Notes (Signed)
Pt states that she has had central and L sided chest pain x 3 hours off and on. Alert and oriented.

## 2016-01-17 NOTE — ED Provider Notes (Signed)
CSN: [id number]     Arrival date & time 01/17/16  1714 History   First MD Initiated Contact with Patient 01/17/16 1905     Chief Complaint  Patient presents with  . Chest Pain    HPI   40 year old female presents today with complaints of chest pain. Patient reports that 11 AM this morning approximately 9 hours prior she had episode of left-sided rib and chest pain. She described this as cramping in nature, lasting approximately 5 minutes. She reports complete resolution, several minutes later she experienced tightness and heaviness in her central chest. Again this lasted approximately 5 minutes. Patient reports associated shortness of breath with the pain, denies any nausea, vomiting, diaphoresis, radiation of symptoms. Patient denies any history of the same, denies any IV lifting or exacerbating activities. Patient denies any previous significant past medical history. She reports that she has been smoking on and off for the last 3 months, increased stress lately, but denies any cardiac history, history of DVT or PE, or any other risk factors for ACS or PE. At the time of evaluation patient reports she is having no chest pain and symptoms have completely resolved. Patient denies any fevers, chills, nausea, vomiting, abdominal pain, indigestion or acid reflux. No significant family cardiac history.    Past Medical History  Diagnosis Date  . Arthritis     Left Knee  . Renal disorder     kidney stone   Past Surgical History  Procedure Laterality Date  . Tubal ligation     History reviewed. No pertinent family history. Social History  Substance Use Topics  . Smoking status: Never Smoker   . Smokeless tobacco: None  . Alcohol Use: No   OB History    No data available     Review of Systems  All other systems reviewed and are negative.   Allergies  Codeine  Home Medications   Prior to Admission medications   Medication Sig Start Date End Date Taking? Authorizing Provider   Aspirin-Salicylamide-Caffeine (ARTHRITIS STRENGTH BC POWDER PO) Take 1 packet by mouth every 6 (six) hours as needed (For knee pain.).    Yes Historical Provider, MD  cetirizine (ZYRTEC) 10 MG tablet Take 10 mg by mouth daily.   Yes Historical Provider, MD  ibuprofen (ADVIL,MOTRIN) 800 MG tablet Take 1 tablet (800 mg total) by mouth 3 (three) times daily. Patient taking differently: Take 800 mg by mouth 3 (three) times daily as needed for moderate pain.  04/20/13  Yes Tatyana Kirichenko, PA-C  Multiple Vitamin (MULTIVITAMIN WITH MINERALS) TABS Take 1 tablet by mouth every morning.    Yes Historical Provider, MD  oxyCODONE-acetaminophen (PERCOCET) 5-325 MG per tablet Take 1-2 tablets by mouth every 4 (four) hours as needed for pain. Patient not taking: Reported on 01/17/2016 04/22/13   Linwood DibblesJon Knapp, MD  promethazine (PHENERGAN) 25 MG tablet Take 1 tablet (25 mg total) by mouth every 6 (six) hours as needed for nausea. Patient not taking: Reported on 01/17/2016 04/20/13   Jaynie Crumbleatyana Kirichenko, PA-C  tamsulosin (FLOMAX) 0.4 MG CAPS capsule Take 1 capsule (0.4 mg total) by mouth daily. Patient not taking: Reported on 01/17/2016 04/20/13   Tatyana Kirichenko, PA-C   BP 121/81 mmHg  Pulse 92  Temp(Src) 98.5 F (36.9 C)  Resp 18  SpO2 99%  LMP 01/16/2016 (Exact Date)   Physical Exam  Constitutional: She is oriented to person, place, and time. She appears well-developed and well-nourished.  HENT:  Head: Normocephalic and atraumatic.  Eyes: Conjunctivae  are normal. Pupils are equal, round, and reactive to light. Right eye exhibits no discharge. Left eye exhibits no discharge. No scleral icterus.  Neck: Normal range of motion. No JVD present. No tracheal deviation present.  Cardiovascular: Regular rhythm, normal heart sounds and intact distal pulses.  Exam reveals no gallop and no friction rub.   No murmur heard. Pulmonary/Chest: Effort normal and breath sounds normal. No stridor. No respiratory distress.  She has no wheezes. She has no rales. She exhibits no tenderness.  Abdominal: Soft. Bowel sounds are normal. She exhibits no distension and no mass. There is no tenderness. There is no rebound and no guarding.  Musculoskeletal: Normal range of motion. She exhibits no edema or tenderness.  Neurological: She is alert and oriented to person, place, and time. Coordination normal.  Skin: Skin is warm and dry. No rash noted. No erythema. No pallor.  Psychiatric: She has a normal mood and affect. Her behavior is normal. Judgment and thought content normal.  Nursing note and vitals reviewed.   ED Course  Procedures (including critical care time) Labs Review Labs Reviewed  BASIC METABOLIC PANEL - Abnormal; Notable for the following:    Glucose, Bld 106 (*)    All other components within normal limits  CBC - Abnormal; Notable for the following:    WBC 11.5 (*)    All other components within normal limits  I-STAT TROPOININ, ED    Imaging Review Dg Chest 2 View  01/17/2016  CLINICAL DATA:  Central left-sided chest pain for several hours EXAM: CHEST  2 VIEW COMPARISON:  None. FINDINGS: The heart size and mediastinal contours are within normal limits. Both lungs are clear. The visualized skeletal structures are unremarkable. IMPRESSION: No active cardiopulmonary disease. Electronically Signed   By: Alcide Clever M.D.   On: 01/17/2016 18:33   I have personally reviewed and evaluated these images and lab results as part of my medical decision-making.   EKG Interpretation None      MDM   Final diagnoses:  Chest pain, unspecified chest pain type    Labs:I-STAT troponin, BNP, CBC  Imaging: DG chest 2 view, ED EKG  Consults:  Therapeutics:   Discharge Meds:   Assessment/Plan: 40 year old female presents with atypical chest pain. She is asymptomatic at the time of evaluation this was several episodes last approximately 5 minutes. Each episode felt differently. Patient is afebrile,  nontoxic, she has reassuring vital signs, laboratory analysis, diagnostic imaging. EKG shows no significant findings, negative troponin, clear chest x-ray. Patient will be instructed to rest, monitor for any new or worsening signs or symptoms, return immediately if any present. I have very low suspicion for PE (PERC negative) , ACS, or any significant intrathoracic abnormality that would be causing patients symptoms.          Eyvonne Mechanic, PA-C 01/17/16 1958  Raeford Razor, MD 01/25/16 [phone]

## 2016-01-17 NOTE — Discharge Instructions (Signed)
Please monitor for any new or worsening signs or symptoms, return if any present.

## 2016-01-20 LAB — PAP IG, APTIMA HPV AND RFX 16/18,45 (507805)
.: 0
HPV APTIMA: NEGATIVE

## 2016-01-20 LAB — PAP IG, APTIMA HPV AND RFX 16/18,45 (199305)
HPV Aptima: NEGATIVE
LABCORP 019018: 0

## 2016-02-04 ENCOUNTER — Inpatient Hospital Stay: Admit: 2016-02-04 | Payer: BLUE CROSS/BLUE SHIELD | Attending: Obstetrics & Gynecology | Primary: Family Medicine

## 2016-02-04 DIAGNOSIS — Z1231 Encounter for screening mammogram for malignant neoplasm of breast: Secondary | ICD-10-CM

## 2016-11-16 ENCOUNTER — Inpatient Hospital Stay
Admit: 2016-11-16 | Discharge: 2016-11-17 | Disposition: A | Discharge status: Home / Self Care | Payer: BLUE CROSS/BLUE SHIELD | Attending: Emergency Medicine

## 2016-11-16 ENCOUNTER — Emergency Department: Admit: 2016-11-16 | Payer: BLUE CROSS/BLUE SHIELD | Primary: Family Medicine

## 2016-11-16 ENCOUNTER — Emergency Department

## 2016-11-16 DIAGNOSIS — R109 Unspecified abdominal pain: Secondary | ICD-10-CM

## 2016-11-16 LAB — CBC WITH AUTOMATED DIFF
ABS. BASOPHILS: 0 10*3/uL (ref 0.0–0.2)
ABS. EOSINOPHILS: 0.2 10*3/uL (ref 0.0–0.8)
ABS. IMM. GRANS.: 0.1 10*3/uL (ref 0.0–0.5)
ABS. LYMPHOCYTES: 4.4 10*3/uL (ref 0.5–4.6)
ABS. MONOCYTES: 1.6 10*3/uL — ABNORMAL HIGH (ref 0.1–1.3)
ABS. NEUTROPHILS: 12.4 10*3/uL — ABNORMAL HIGH (ref 1.7–8.2)
BASOPHILS: 0 % (ref 0.0–2.0)
EOSINOPHILS: 1 % (ref 0.5–7.8)
HCT: 43.8 % (ref 35.8–46.3)
HGB: 14.2 g/dL (ref 11.7–15.4)
IMMATURE GRANULOCYTES: 0 % (ref 0.0–5.0)
LYMPHOCYTES: 23 % (ref 13–44)
MCH: 29.3 PG (ref 26.1–32.9)
MCHC: 32.4 g/dL (ref 31.4–35.0)
MCV: 90.3 FL (ref 79.6–97.8)
MONOCYTES: 9 % (ref 4.0–12.0)
MPV: 10 FL — ABNORMAL LOW (ref 10.8–14.1)
NEUTROPHILS: 67 % (ref 43–78)
PLATELET: 319 10*3/uL (ref 150–450)
RBC: 4.85 M/uL (ref 4.05–5.25)
RDW: 13 % (ref 11.9–14.6)
WBC: 18.7 10*3/uL — ABNORMAL HIGH (ref 4.3–11.1)

## 2016-11-16 LAB — METABOLIC PANEL, COMPREHENSIVE
A-G Ratio: 0.9 — ABNORMAL LOW (ref 1.2–3.5)
ALT (SGPT): 22 U/L (ref 12–65)
AST (SGOT): 20 U/L (ref 15–37)
Albumin: 4.1 g/dL (ref 3.5–5.0)
Alk. phosphatase: 134 U/L (ref 50–136)
Anion gap: 9 mmol/L (ref 7–16)
BUN: 15 MG/DL (ref 6–23)
Bilirubin, total: 0.5 MG/DL (ref 0.2–1.1)
CO2: 29 mmol/L (ref 21–32)
Calcium: 10.4 MG/DL (ref 8.3–10.4)
Chloride: 102 mmol/L (ref 98–107)
Creatinine: 0.92 MG/DL (ref 0.6–1.0)
GFR est AA: >60 mL/min/{1.73_m2} (ref >60)
GFR est non-AA: >60 mL/min/{1.73_m2} (ref >60)
Globulin: 4.4 g/dL — ABNORMAL HIGH (ref 2.3–3.5)
Glucose: 117 mg/dL — ABNORMAL HIGH (ref 65–100)
Potassium: 4.1 mmol/L (ref 3.5–5.1)
Protein, total: 8.5 g/dL — ABNORMAL HIGH (ref 6.3–8.2)
Sodium: 140 mmol/L (ref 136–145)

## 2016-11-16 LAB — URINALYSIS W/ RFLX MICROSCOPIC
Bilirubin: NEGATIVE
Blood: NEGATIVE
Glucose: NEGATIVE mg/dL
Ketone: NEGATIVE mg/dL
Leukocyte Esterase: NEGATIVE
Nitrites: NEGATIVE
Protein: NEGATIVE mg/dL
Specific gravity: 1.028 — ABNORMAL HIGH (ref 1.001–1.023)
Urobilinogen: 0.2 EU/dL (ref 0.2–1.0)
pH (UA): 6.5 (ref 5.0–9.0)

## 2016-11-16 LAB — LIPASE: Lipase: 494 U/L — ABNORMAL HIGH (ref 73–393)

## 2016-11-16 MED ORDER — IOPAMIDOL 76 % IV SOLN
370 mg iodine /mL (76 %) | Freq: Once | INTRAVENOUS | Status: AC
Start: 2016-11-16 — End: 2016-11-16
  Administered 2016-11-16: via INTRAVENOUS

## 2016-11-16 MED ORDER — SODIUM CHLORIDE 0.9% BOLUS IV
0.9 % | Freq: Once | INTRAVENOUS | Status: AC
Start: 2016-11-16 — End: 2016-11-16
  Administered 2016-11-16: via INTRAVENOUS

## 2016-11-16 MED ORDER — SALINE PERIPHERAL FLUSH PRN
Freq: Once | INTRAMUSCULAR | Status: AC
Start: 2016-11-16 — End: 2016-11-16
  Administered 2016-11-16

## 2016-11-16 MED ORDER — ONDANSETRON 4 MG TAB, RAPID DISSOLVE
4 mg | Freq: Once | ORAL | Status: AC
Start: 2016-11-16 — End: 2016-11-16
  Administered 2016-11-16: 22:00:00 via ORAL

## 2016-11-16 MED FILL — ONDANSETRON 4 MG TAB, RAPID DISSOLVE: 4 mg | ORAL | Qty: 1

## 2016-11-16 NOTE — ED Provider Notes (Signed)
HPI Comments: Patient complains of right flank pain, RUQ pain onset yesterday. Nausea with no vomiting. No diarrhea. No dysuria. No fever. She has nocturia that is chronic. No rash. Was seen by her GI doctor in [place] 2 weeks ago for left flank pain. Has had EGD and colonoscopy in the past. Has had a hysterectomy. She is on Xarelto for thrombosis.     Patient is a 41 y.o. female presenting with abdominal pain. The history is provided by the patient and medical records.   Abdominal Pain    This is a new problem. The current episode started yesterday. The problem occurs constantly. The problem has not changed since onset.The pain is located in the RUQ. The pain is moderate. Associated symptoms include nausea and back pain. Pertinent negatives include no fever, no diarrhea, no vomiting, no constipation, no dysuria, no frequency and no hematuria. Nothing worsens the pain. The pain is relieved by nothing.        Past Medical History:   Diagnosis Date   ??? Abnormal uterine bleeding (AUB) 03/23/2014   ??? Adenomyosis    ??? Anxiety     no present treatment    ??? Enlarged uterus    ??? History of DVT (deep vein thrombosis) 03/23/2014   ??? Menorrhagia    ??? Psychiatric disorder    ??? 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 Unspecified adverse effect of anesthesia     has woken up after anesthesia "violent" in the past; pt states just due stressfull situations       Past Surgical History:   Procedure Laterality Date   ??? HX CERVICAL FUSION  09/2012 at [facility]    C6 - C7 with hardware; Dr. Kirstie Peri   ??? HX CESAREAN SECTION  1993   ??? HX COLONOSCOPY  12/2013   ??? HX DILATION AND CURETTAGE      due to miscarriage   ??? HX ENDOSCOPY  12/2013   ??? HX HYSTERECTOMY     ??? HX WISDOM TEETH EXTRACTION           Family History:   Problem Relation Age of Onset   ??? Hypertension Father    ??? Hypertension Mother    ??? Breast Cancer Maternal Grandmother 60   ??? Heart Disease Maternal Grandmother    ??? Hypertension Maternal Grandmother     ??? Diabetes Paternal Grandmother    ??? Heart Disease Paternal Grandmother    ??? Hypertension Paternal Grandmother        Social History     Social History   ??? Marital status: MARRIED     Spouse name: N/A   ??? Number of children: N/A   ??? Years of education: N/A     Occupational History   ??? Not on file.     Social History Main Topics   ??? Smoking status: Former Smoker     Packs/day: 0.50     Years: 6.00   ??? Smokeless tobacco: Never Used      Comment: stopped in 2013   ??? Alcohol use Yes      Comment: Rare   ??? Drug use: Yes     Special: Marijuana      Comment: occasional use of marijuana   ??? Sexual activity: Yes     Partners: Male     Birth control/ protection: Surgical     Other Topics Concern   ??? Not on file     Social History Narrative  ALLERGIES: Codeine    Review of Systems   Constitutional: Positive for appetite change. Negative for chills and fever.   HENT: Negative.    Eyes: Negative.    Respiratory: Negative.    Cardiovascular: Negative.    Gastrointestinal: Positive for abdominal pain and nausea. Negative for constipation, diarrhea and vomiting.   Genitourinary: Positive for flank pain. Negative for dysuria, frequency, hematuria and urgency.   Musculoskeletal: Positive for back pain.   Skin: Negative for rash.   Neurological: Negative.    Hematological:        Xarelto for thrombosis   Psychiatric/Behavioral: Negative.         Stress       Vitals:    11/16/16 1708 11/16/16 1907   BP: 108/73 106/54   Pulse: 91 88   Resp: 17    Temp: 98.2 ??F (36.8 ??C) 98.4 ??F (36.9 ??C)   SpO2: 100% 99%   Weight: 88.9 kg (196 lb)    Height: 5\' 3"  (1.6 m)             Physical Exam   Constitutional: She is oriented to person, place, and time. She appears well-developed and well-nourished.   HENT:   Head: Normocephalic and atraumatic.   Right Ear: External ear normal.   Left Ear: External ear normal.   Mouth/Throat: Oropharynx is clear and moist.   Eyes: Conjunctivae and EOM are normal. Pupils are equal, round, and  reactive to light. No scleral icterus.   Neck: Normal range of motion. Neck supple. No JVD present.   Cardiovascular: Normal rate, regular rhythm, normal heart sounds and intact distal pulses.    Pulmonary/Chest: Effort normal and breath sounds normal.   Abdominal: Soft. Bowel sounds are normal. She exhibits no distension and no mass. There is tenderness (RUQ and RLQ). There is no rebound and no guarding.   Musculoskeletal: Normal range of motion. She exhibits no edema or tenderness.   Neurological: She is alert and oriented to person, place, and time.   Skin: Skin is warm and dry. No rash noted.   Psychiatric: She has a normal mood and affect. Her behavior is normal.   Nursing note and vitals reviewed.       MDM      ED Course       Procedures    Right sided abdominal pain  Labs reviewed  Urine clear  Zofran 4 mg po  US - no gallstones  CT abdomen - ovarian cysts  Results and instructions discussed with patient  Rx Zofran  Follow up with PCP for further evaluation  Return with new or worsening symptoms

## 2016-11-16 NOTE — ED Notes (Signed)
Report to Valera CastleAnn S. RN, Patient in [place]

## 2016-11-16 NOTE — ED Notes (Signed)
I have reviewed discharge instructions with the patient.  The patient verbalized understanding.    Patient left ED via Discharge Method: ambulatory to Home with (insert name of family/friend, self, transport alone).    Opportunity for questions and clarification provided.       Patient given 1 scripts.         To continue your aftercare when you leave the hospital, you may receive an automated call from our care team to check in on how you are doing.  This is a free service and part of our promise to provide the best care and service to meet your aftercare needs.??? If you have questions, or wish to unsubscribe from this service please call [phone].  Thank you for Choosing our [facility].

## 2016-11-16 NOTE — ED Notes (Signed)
Report received

## 2016-11-16 NOTE — ED Notes (Signed)
Dr. Maisie Fushomas, ER MD with patient  Patient still experiencing abdominal pain

## 2016-11-16 NOTE — ED Triage Notes (Signed)
Pt states she is having abdominal pain that started last night and is not getting better. Pt states it goes from naval across to flank and right now it is in her right flank. Pt appears in pain, rocking back and forth in triage.

## 2016-11-17 MED ORDER — ONDANSETRON HCL 4 MG TAB
4 mg | ORAL_TABLET | Freq: Three times a day (TID) | ORAL | 0 refills | Status: DC | PRN
Start: 2016-11-17 — End: 2017-01-22

## 2017-01-22 ENCOUNTER — Encounter

## 2017-01-22 ENCOUNTER — Ambulatory Visit
Admit: 2017-01-22 | Discharge: 2017-01-22 | Payer: BLUE CROSS/BLUE SHIELD | Attending: Obstetrics & Gynecology | Primary: Family Medicine

## 2017-01-22 DIAGNOSIS — Z01419 Encounter for gynecological examination (general) (routine) without abnormal findings: Secondary | ICD-10-CM

## 2017-01-22 LAB — AMB POC URINALYSIS DIP STICK AUTO W/O MICRO
Bilirubin (UA POC): NEGATIVE
Blood (UA POC): NEGATIVE
Glucose (UA POC): NEGATIVE
Ketones (UA POC): NEGATIVE
Leukocyte esterase (UA POC): NEGATIVE
Nitrites (UA POC): NEGATIVE
Specific gravity (UA POC): 1.015 (ref 1.001–1.035)
Urobilinogen (UA POC): 0.2 (ref 0.2–1)
pH (UA POC): 8.5 — AB (ref 4.6–8.0)

## 2017-01-22 LAB — AMB POC HEMOGLOBIN (HGB): Hemoglobin (POC): 13.9

## 2017-01-22 NOTE — Progress Notes (Signed)
HPI:  [name] is a 41 y.o. female G5 P0014 who is here today for a well woman exam. She complains of nothing. She would like to discuss the ultrasound and CT scan that was done on 11/16/16 for RLQ pain.    Functional cyst seen on us.  Discussed each month ovulation process and should reabsorb. She think she is having pms but is having discomfort from time to time. She was in a lot of pain when she went to ER.           Date Performed Result   PAP 01/17/16 Neg hpv neg   Mammogram 02/04/16 Neg - dense   Colonoscopy 12/2013    Dexa na        OB History     Gravida Para Term Preterm AB Living    5 4   1 4     SAB TAB Ectopic Molar Multiple Live Births         4      son 24yo.    GYN History     Patient's last menstrual period was 03/01/2014 (exact date).         Past Medical History:   Diagnosis Date   ??? Abnormal uterine bleeding (AUB) 03/23/2014   ??? Adenomyosis    ??? Anxiety     no present treatment    ??? [facility] and CT [facility]    ??? Enlarged uterus    ??? History of DVT (deep vein thrombosis) 03/23/2014   ??? Menorrhagia    ??? Psychiatric disorder    ??? 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 Unspecified adverse effect of anesthesia     has woken up after anesthesia "violent" in the past; pt states just due stressfull situations     Past Surgical History:   Procedure Laterality Date   ??? [facility]    C6 - C7 with hardware; Dr. Kirstie PeriBucci   ??? HX CESAREAN SECTION  1993   ??? HX COLONOSCOPY  12/2013   ??? HX DILATION AND CURETTAGE      due to miscarriage   ??? HX ENDOSCOPY  12/2013   ??? HX HYSTERECTOMY     ??? HX WISDOM TEETH EXTRACTION         Allergies   Allergen Reactions   ??? Codeine Nausea Only       Current Outpatient Prescriptions   Medication Sig Dispense Refill   ??? RIVAROXABAN (XARELTO PO) Take  by mouth.           Social History     Social History   ??? Marital status: MARRIED     Spouse name: N/A   ??? Number of children: N/A    ??? Years of education: N/A     Occupational History   ??? Works 2 jobs.      Social History Main Topics   ??? Smoking status: Former Smoker     Packs/day: 0.50     Years: 6.00   ??? Smokeless tobacco: Never Used      Comment: stopped in 2013   ??? Alcohol use Yes      Comment: Rare   ??? Drug use: Yes     Special: Marijuana      Comment: occasional use of marijuana   ??? Sexual activity: Yes     Partners: Male  Birth control/ protection: Surgical      Comment: partical hysterectomy      Other Topics Concern   ??? Exercise  - no      Social History Narrative     Family History   Problem Relation Age of Onset   ??? Hypertension Father    ??? Hypertension Mother    ??? Breast Cancer Maternal Grandmother 60   ??? Heart Disease Maternal Grandmother    ??? Hypertension Maternal Grandmother    ??? Diabetes Paternal Grandmother    ??? Heart Disease Paternal Grandmother    ??? Hypertension Paternal Grandmother        Review of Systems   Constitutional: Negative for chills and fever.   HENT: Negative for congestion.    Eyes: Negative for blurred vision and double vision.   Respiratory: Negative for shortness of breath.    Cardiovascular: Negative for chest pain.   Gastrointestinal: Negative for constipation, diarrhea, nausea and vomiting.   Genitourinary: Negative for dysuria.   Musculoskeletal: Negative for joint pain.   Skin: Negative for rash.   Neurological: Negative for dizziness, loss of consciousness and headaches.   Psychiatric/Behavioral: Negative for depression. The patient is not nervous/anxious.         Visit Vitals   ??? BP 102/64   ??? Ht 5\' 3"  (1.6 m)   ??? Wt 190 lb (86.2 kg)   ??? LMP 03/01/2014 (Exact Date)   ??? BMI 33.66 kg/m2      Results for orders placed or performed in visit on 01/22/17   AMB POC URINALYSIS DIP STICK AUTO W/O MICRO     Status: Abnormal   Result Value Ref Range Status    Color (UA POC) Yellow  Final    Clarity (UA POC) Clear  Final    Glucose (UA POC) Negative Negative Final    Bilirubin (UA POC) Negative Negative Final     Ketones (UA POC) Negative Negative Final    Specific gravity (UA POC) 1.015 1.001 - 1.035 Final    Blood (UA POC) Negative Negative Final    pH (UA POC) 8.5 (A) 4.6 - 8.0 Final    Protein (UA POC) Trace Negative Final    Urobilinogen (UA POC) 0.2 mg/dL 0.2 - 1 Final    Nitrites (UA POC) Negative Negative Final    Leukocyte esterase (UA POC) Negative Negative Final   Results for orders placed or performed in visit on 06/07/14   AMB POC URINALYSIS DIP STICK AUTO W/ MICRO (PGU)     Status: None   Result Value Ref Range Status    Glucose (UA POC) Negative Negative mg/dL Final    Bilirubin (UA POC) Negative Negative Final    Ketones (UA POC) Negative Negative Final    Specific gravity (UA POC) 1.020 1.001 - 1.035 Final    Blood (UA POC) Negative Negative Final    pH (UA POC) 7.0 4.6 - 8.0 Final    Protein (UA POC) Negative Negative Final    Urobilinogen (POC) 0.2 mg/dL  Final    Nitrites (UA POC) Negative Negative Final    Leukocyte esterase (UA POC) Negative Negative Final       Physical Exam   Constitutional: She is oriented to person, place, and time. Vital signs are normal. She appears well-developed and well-nourished. No distress.   HENT:   Head: Normocephalic and atraumatic.   Eyes: Conjunctivae are normal. Right eye exhibits no discharge. Left eye exhibits no discharge.   Neck: Normal range  of motion. Neck supple. No thyromegaly present.   Cardiovascular: Normal rate and regular rhythm.  Exam reveals no gallop and no friction rub.    No murmur heard.  Pulmonary/Chest: Effort normal and breath sounds normal. No respiratory distress. She has no wheezes. She has no rales. Right breast exhibits no inverted nipple, no mass, no nipple discharge, no skin change and no tenderness. Left breast exhibits no inverted nipple, no mass, no nipple discharge, no skin change and no tenderness.   Abdominal: Soft. Bowel sounds are normal. She exhibits no distension and  no mass. There is no tenderness. There is no rebound and no guarding.   Genitourinary: Vagina normal. No breast swelling, tenderness, discharge or bleeding. There is no rash or tenderness on the right labia. There is no rash or tenderness on the left labia. Right adnexum displays no mass. Left adnexum displays no mass. No foreign body in the vagina.   Musculoskeletal: Normal range of motion.   Neurological: She is alert and oriented to person, place, and time. Coordination normal.   Skin: Skin is warm and dry. She is not diaphoretic.   Psychiatric: She has a normal mood and affect. Her behavior is normal. Judgment and thought content normal.   Vitals reviewed.      Assessment/Plan  Diagnoses and all orders for this visit:    1. Well woman exam  -     Hemoglobin (16109)  -     Urinalysis, Auto w/o Micro (81003)  -     Bilateral (Routine screening, yearly, no symptoms); Future    2. Screening for iron deficiency anemia  -     Hemoglobin (60454)    3. Screening for genitourinary condition  -     Urinalysis, Auto w/o Micro (81003)    4. Screening mammogram, encounter for  -     Bilateral (Routine screening, yearly, no symptoms); Future     pap deferred  Will have her return for an [place] with complaint of lower quandrant pains.  Reassured pt.      Follow-up Disposition:  Return if symptoms worsen or fail to improve.    Witt Plitt D.O

## 2017-01-24 ENCOUNTER — Ambulatory Visit: Admit: 2017-01-24 | Discharge: 2017-01-24 | Payer: BLUE CROSS/BLUE SHIELD | Primary: Family Medicine

## 2017-01-24 ENCOUNTER — Ambulatory Visit: Primary: Family Medicine

## 2017-01-24 DIAGNOSIS — R102 Pelvic and perineal pain: Secondary | ICD-10-CM

## 2017-01-24 NOTE — Progress Notes (Signed)
GYN US 5.24.18

## 2017-01-30 NOTE — Telephone Encounter (Signed)
Swp and notified that ultrasound was wnl. Pt verbalized understanding.

## 2017-01-30 NOTE — Telephone Encounter (Signed)
-----   Message from Malcolm MetroBrandi Kay Alt, DO sent at 01/25/2017  9:20 AM EDT -----  Regarding: RE: GYN US  Her us looks wnl -small ovaries  -no adnexal masses.   ----- Message -----     From: Jearld LeschMary K. Repaire     Sent: 01/24/2017  11:28 AM       To: Malcolm MetroBrandi Kay Alt, DO, Tyler DeisErika K Cassity  Subject: GYN US                                           GYN US only to review dated 5.24.18  [facility]

## 2017-02-16 ENCOUNTER — Inpatient Hospital Stay: Admit: 2017-02-16 | Payer: BLUE CROSS/BLUE SHIELD | Attending: Obstetrics & Gynecology | Primary: Family Medicine

## 2017-02-16 DIAGNOSIS — Z1231 Encounter for screening mammogram for malignant neoplasm of breast: Secondary | ICD-10-CM

## 2017-02-18 ENCOUNTER — Encounter (HOSPITAL_COMMUNITY): Payer: Self-pay | Admitting: *Deleted

## 2017-02-18 ENCOUNTER — Emergency Department (HOSPITAL_COMMUNITY)
Admission: EM | Admit: 2017-02-18 | Discharge: 2017-02-18 | Disposition: A | Discharge status: Home / Self Care | Payer: Self-pay | Attending: Emergency Medicine | Admitting: Emergency Medicine

## 2017-02-18 DIAGNOSIS — Z79899 Other long term (current) drug therapy: Secondary | ICD-10-CM | POA: Insufficient documentation

## 2017-02-18 DIAGNOSIS — Z7982 Long term (current) use of aspirin: Secondary | ICD-10-CM | POA: Insufficient documentation

## 2017-02-18 DIAGNOSIS — L02219 Cutaneous abscess of trunk, unspecified: Secondary | ICD-10-CM | POA: Insufficient documentation

## 2017-02-18 DIAGNOSIS — L03319 Cellulitis of trunk, unspecified: Secondary | ICD-10-CM | POA: Insufficient documentation

## 2017-02-18 MED ORDER — LIDOCAINE-EPINEPHRINE (PF) 2 %-1:200000 IJ SOLN
20.0000 mL | Freq: Once | INTRAMUSCULAR | Status: DC
Start: 2017-02-18 — End: 2017-02-18
  Filled 2017-02-18: qty 20

## 2017-02-18 MED ORDER — SULFAMETHOXAZOLE-TRIMETHOPRIM 800-160 MG PO TABS: 1.0000 | ORAL_TABLET | Freq: Two times a day (BID) | ORAL | 0 refills | Status: AC

## 2017-02-18 NOTE — ED Provider Notes (Signed)
[facility]-EMERGENCY DEPT Provider Note   CSN: [id number] Arrival date & time: 02/18/17  1459  By signing my name below, I, Phillips Climes, attest that this documentation has been prepared under the direction and in the presence of Chalee Hirota, PA-C. Electronically Signed: Phillips Climes, Scribe. 02/18/2017. 7:44 PM.  History   Chief Complaint No chief complaint on file.  HPI Comments Ann Haney is a 41 y.o. female who presents to the Emergency Department with complaints of a sudden onset painful and erythematous patch on the skin of her abdomen x1 week.  Pt initially believed this was a boil, but now assumes it is a spider bite, as the pain and irritation has increased since onset.  No fevers or chills.  Pt has attempted to treat her sx with epsom salt baths and hot/warm compress with short-term relief. No rashes elsewhere. Pt is not immunocompromised, and denies recent steroid use.  Pt has not attempted any OTC symptomatic management before presenting today for evaluation.  The history is provided by the patient and medical records. No language interpreter was used.   Past Medical History:  Diagnosis Date  . Arthritis    Left Knee  . Renal disorder    kidney stone    There are no active problems to display for this patient.   Past Surgical History:  Procedure Laterality Date  . TUBAL LIGATION      OB History    No data available       Home Medications    Prior to Admission medications   Medication Sig Start Date End Date Taking? Authorizing Provider  Aspirin-Salicylamide-Caffeine (ARTHRITIS STRENGTH BC POWDER PO) Take 1 packet by mouth every 6 (six) hours as needed (For knee pain.).     [provider]  cetirizine (ZYRTEC) 10 MG tablet Take 10 mg by mouth daily.    [provider]  ibuprofen (ADVIL,MOTRIN) 800 MG tablet Take 1 tablet (800 mg total) by mouth 3 (three) times daily. Patient taking differently: Take 800 mg by mouth 3 (three)  times daily as needed for moderate pain.  04/20/13   Kirichenko, Lemont Fillers, PA-C  Multiple Vitamin (MULTIVITAMIN WITH MINERALS) TABS Take 1 tablet by mouth every morning.     [provider]  oxyCODONE-acetaminophen (PERCOCET) 5-325 MG per tablet Take 1-2 tablets by mouth every 4 (four) hours as needed for pain. Patient not taking: Reported on 01/17/2016 04/22/13   Linwood Dibbles, MD  promethazine (PHENERGAN) 25 MG tablet Take 1 tablet (25 mg total) by mouth every 6 (six) hours as needed for nausea. Patient not taking: Reported on 01/17/2016 04/20/13   Jaynie Crumble, PA-C  sulfamethoxazole-trimethoprim (BACTRIM DS,SEPTRA DS) 800-160 MG tablet Take 1 tablet by mouth 2 (two) times daily. 02/18/17 02/25/17  Aarsh Fristoe, PA-C  tamsulosin (FLOMAX) 0.4 MG CAPS capsule Take 1 capsule (0.4 mg total) by mouth daily. Patient not taking: Reported on 01/17/2016 04/20/13   Jaynie Crumble, PA-C    Family History No family history on file.  Social History Social History  Substance Use Topics  . Smoking status: Never Smoker  . Smokeless tobacco: Never Used  . Alcohol use Yes     Comment: occasionally     Allergies   Codeine   Review of Systems Review of Systems  Constitutional: Negative for chills and fever.  Respiratory: Negative for shortness of breath.   Cardiovascular: Negative for chest pain.  Skin: Negative for rash and wound.       Lesion on abd  Physical Exam Updated Vital Signs BP 119/82 (BP Location: Right Arm)   Pulse 73   Temp 98.1 F (36.7 C) (Oral)   Resp 18   Ht 5\' 6"  (1.676 m)   Wt 108.9 kg (240 lb)   LMP 02/01/2017   SpO2 100%   BMI 38.74 kg/m   Physical Exam  Constitutional: She is oriented to person, place, and time. She appears well-developed and well-nourished. No distress.  HENT:  Head: Normocephalic and atraumatic.  Eyes: Conjunctivae and EOM are normal. Pupils are equal, round, and reactive to light.  Neck: Normal range of motion. Neck  supple.  Cardiovascular: Normal rate, regular rhythm and normal heart sounds.  Exam reveals no gallop and no friction rub.   No murmur heard. Pulmonary/Chest: Effort normal and breath sounds normal. No respiratory distress. She has no wheezes. She has no rales.  Abdominal: Soft. She exhibits no distension. There is no tenderness. There is no rebound and no guarding.  Musculoskeletal: Normal range of motion. She exhibits no edema or deformity.  Neurological: She is alert and oriented to person, place, and time.  Skin: Skin is warm and dry.  Lesion on the right sided abdomen with about 1.5cm diameter fluctuant area with a visible head.  Surrounding erythema extending out 2 cm.  Site is warm.  No drainage at this time.  No rashes noted elsewhere.  Psychiatric: She has a normal mood and affect.   ED Treatments / Results  DIAGNOSTIC STUDIES: Oxygen Saturation is 100% on room air, normal by my interpretation.  COORDINATION OF CARE: 4:00 PM Discussed treatment plan with pt at bedside and pt agreed to plan.  Labs (all labs ordered are listed, but only abnormal results are displayed) Labs Reviewed - No data to display  EKG  EKG Interpretation None      Radiology No results found.  Procedures .Marland KitchenIncision and Drainage Date/Time: 02/18/2017 4:52 PM Performed by: Alveria Apley Authorized by: Alveria Apley   Consent:    Consent obtained:  Verbal   Consent given by:  Patient   Risks discussed:  Bleeding, damage to other organs, infection, incomplete drainage and pain   Alternatives discussed:  No treatment Location:    Type:  Abscess   Size:  1.5cm   Location:  Trunk   Trunk location:  Abdomen Post-procedure details:    Patient tolerance of procedure:  Tolerated well, no immediate complications    (including critical care time)  Medications Ordered in ED Medications  lidocaine-EPINEPHrine (XYLOCAINE W/EPI) 2 %-1:200000 (PF) injection 20 mL (not administered)      Initial Impression / Assessment and Plan / ED Course  I have reviewed the triage vital signs and the nursing notes.  Pertinent labs & imaging results that were available during my care of the patient were reviewed by me and considered in my medical decision making (see chart for details).     Physical exam shows fluctuant lesion on the abdomen. There are no signs of tunneling, or intra-abdominal infection. Patient without systemic symptoms, and pain is localized to the site of erythema. Will I&D abscess.  Patient tolerated procedure well, minimal purulent material was expressed. Will discharge with antibiotics. Patient to use ibuprofen or Tylenol as needed for pain control. Return precautions discussed. Patient agrees to plan.  Final Clinical Impressions(s) / ED Diagnoses   Final diagnoses:  Cellulitis and abscess of trunk   I personally performed the services described in this documentation, which was scribed in my presence. The recorded information has  been reviewed and is accurate.   New Prescriptions Discharge Medication List as of 02/18/2017  4:57 PM    START taking these medications   Details  sulfamethoxazole-trimethoprim (BACTRIM DS,SEPTRA DS) 800-160 MG tablet Take 1 tablet by mouth 2 (two) times daily., Starting Mon 02/18/2017, Until Mon 02/25/2017, Print         Cheat [place], Pine GroveSophia, PA-C 02/18/17 1944    Jerelyn ScottLinker, Martha, MD 02/18/17 [phone]

## 2017-02-18 NOTE — Discharge Instructions (Signed)
Take Bactrim as prescribed. He may use Tylenol or ibuprofen as needed for pain relief. Keep the dressing on for 24 hours, and then he may wash it gently and redress until healed. Return to the ED if you develop fever, chills, worsening redness, or any new abdominal symptoms including nausea, vomiting, or bloating.

## 2017-03-11 IMAGING — CR DG CHEST 2V
2 series · 2 of 2 positions shown · non-contrast
Comparison: None.

CLINICAL DATA: Central left-sided chest pain for several hours

EXAM:
CHEST  2 VIEW

[w chest pa]
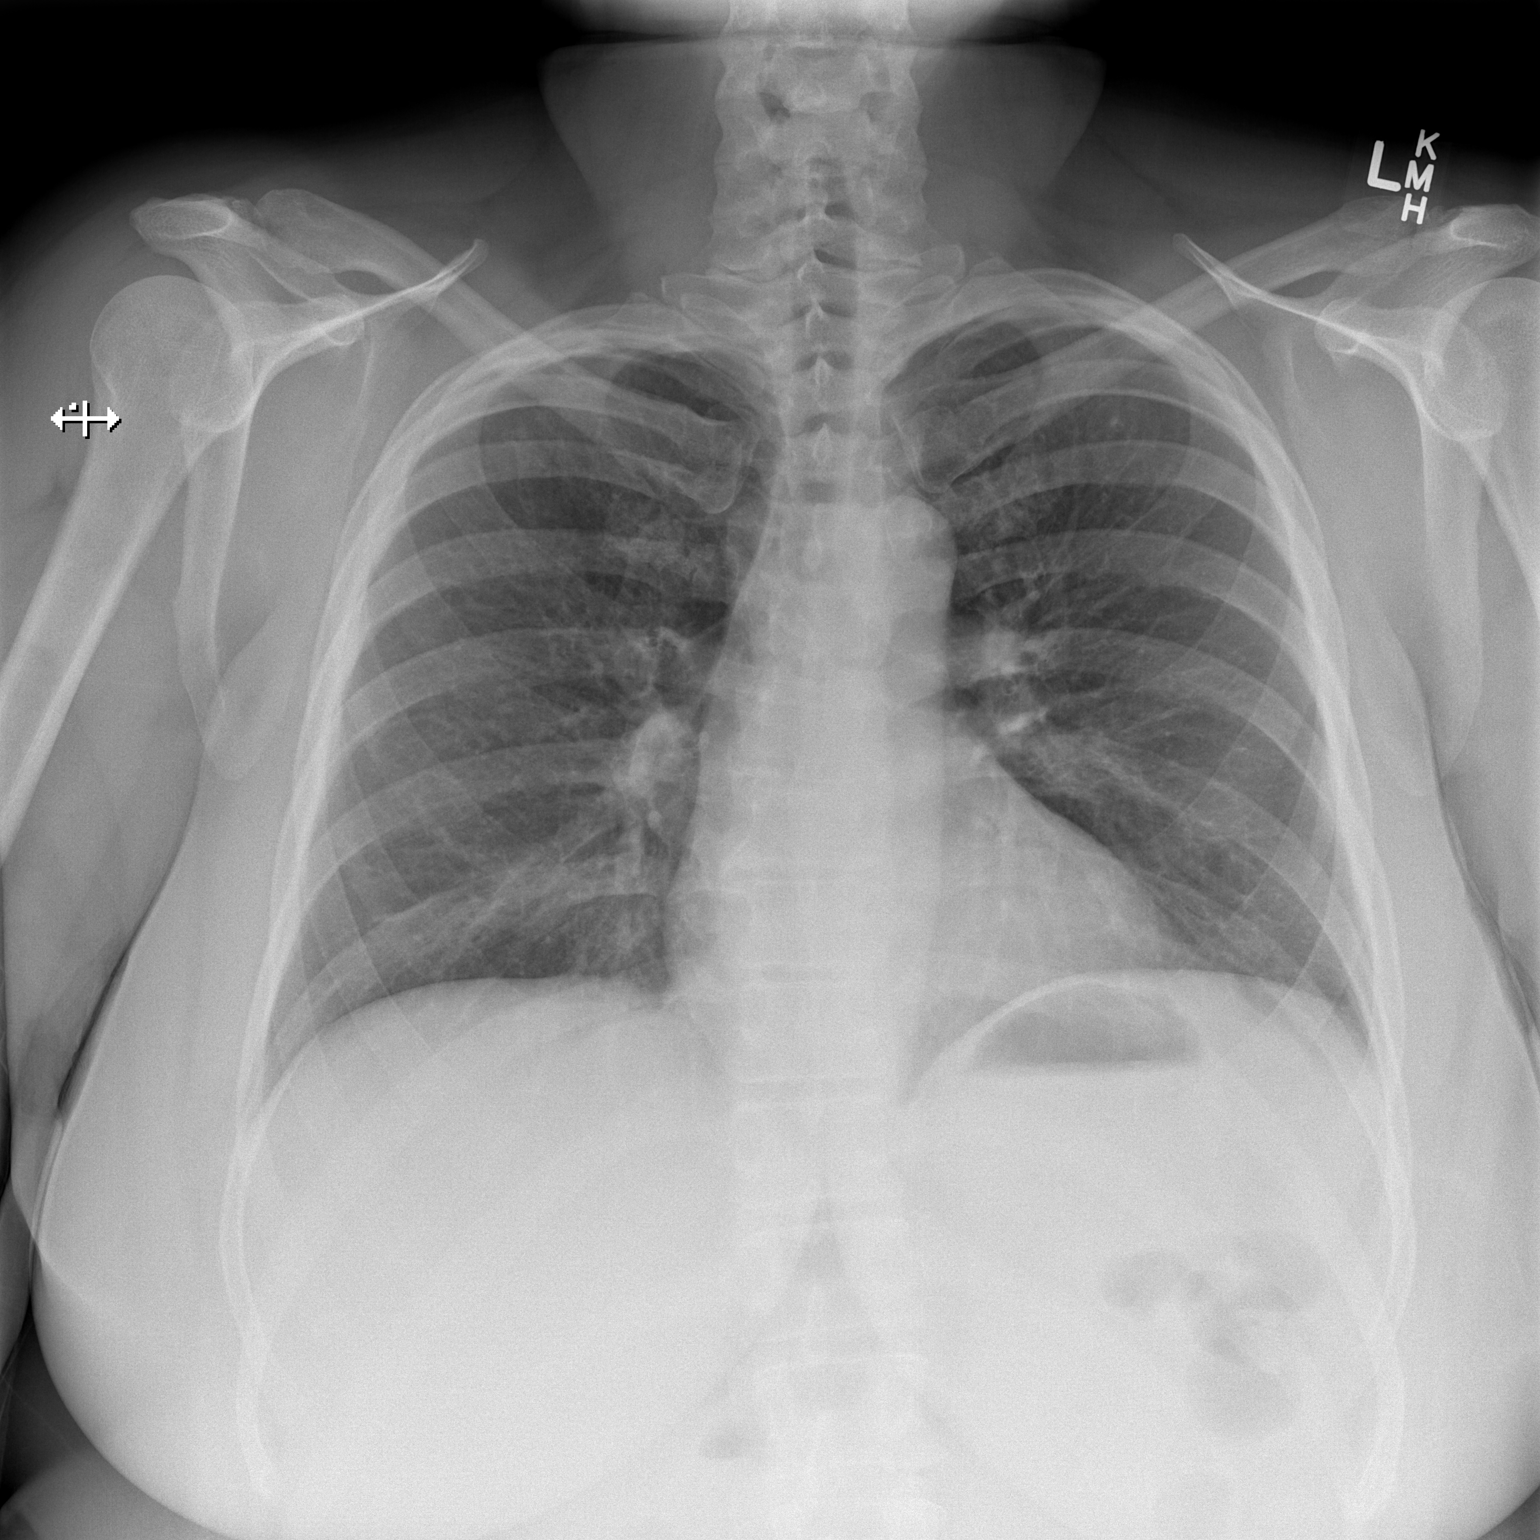

[w chest lat]
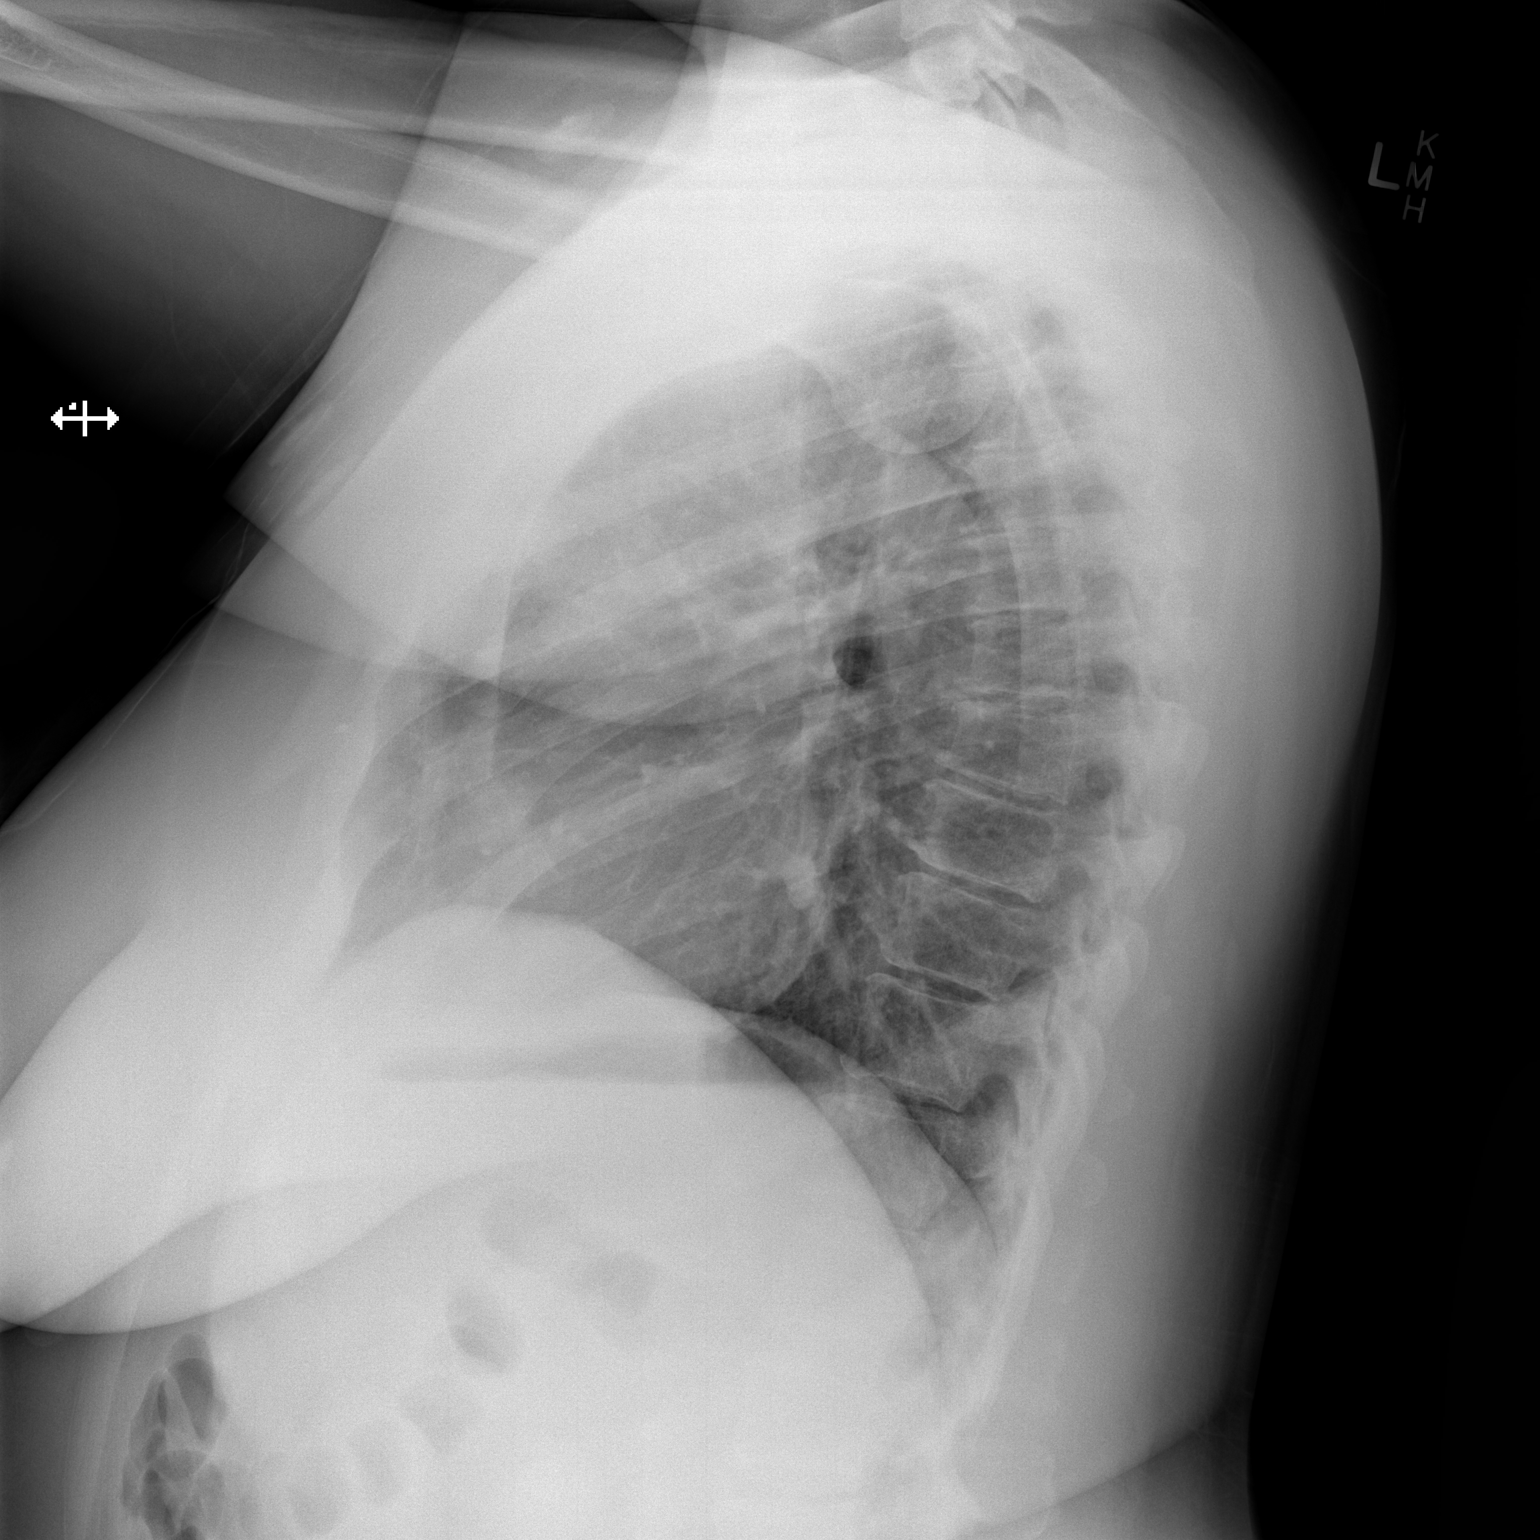

[2 of 2 positions shown; findings below may reference images not displayed]

FINDINGS: The heart size and mediastinal contours are within normal limits.
Both lungs are clear. The visualized skeletal structures are
unremarkable.
IMPRESSION: No active cardiopulmonary disease.

## 2017-12-27 DIAGNOSIS — R1032 Left lower quadrant pain: Secondary | ICD-10-CM

## 2017-12-27 NOTE — ED Triage Notes (Signed)
Pt states she is having pelvic pain and feels like something is falling out of her vagina.  She has been bouncing on a trampoline for 2 nights,but does not know if that has anything to do with her pain.

## 2017-12-28 ENCOUNTER — Inpatient Hospital Stay
Admit: 2017-12-28 | Discharge: 2017-12-28 | Disposition: A | Discharge status: Home / Self Care | Payer: BLUE CROSS/BLUE SHIELD | Attending: Emergency Medicine

## 2017-12-28 LAB — METABOLIC PANEL, COMPREHENSIVE
A-G Ratio: 1
ALT (SGPT): 19 U/L (ref 12–65)
AST (SGOT): 9 U/L — ABNORMAL LOW (ref 15–37)
Albumin: 3.7 g/dL (ref 3.5–5.0)
Alk. phosphatase: 110 U/L (ref 50–136)
Anion gap: 7 mmol/L
BUN: 10 MG/DL (ref 6–23)
Bilirubin, total: 0.3 MG/DL (ref 0.2–1.1)
CO2: 27 mmol/L (ref 21–32)
Calcium: 9 MG/DL (ref 8.3–10.4)
Chloride: 106 mmol/L (ref 98–107)
Creatinine: 0.76 MG/DL (ref 0.6–1.0)
GFR est AA: >60 mL/min/{1.73_m2} (ref >60)
GFR est non-AA: >60 mL/min/{1.73_m2}
Globulin: 3.8 g/dL — ABNORMAL HIGH (ref 2.3–3.5)
Glucose: 92 mg/dL (ref 65–100)
Potassium: 4.1 mmol/L (ref 3.5–5.1)
Protein, total: 7.5 g/dL
Sodium: 140 mmol/L (ref 136–145)

## 2017-12-28 LAB — CBC WITH AUTOMATED DIFF
ABS. BASOPHILS: 0 10*3/uL (ref 0.0–0.2)
ABS. EOSINOPHILS: 0.2 10*3/uL (ref 0.0–0.8)
ABS. IMM. GRANS.: 0 10*3/uL (ref 0.0–0.5)
ABS. LYMPHOCYTES: 2.8 10*3/uL (ref 0.5–4.6)
ABS. MONOCYTES: 1 10*3/uL (ref 0.1–1.3)
ABS. NEUTROPHILS: 7.2 10*3/uL (ref 1.7–8.2)
ABSOLUTE NRBC: 0 10*3/uL (ref 0.0–0.2)
BASOPHILS: 0 % (ref 0.0–2.0)
EOSINOPHILS: 1 % (ref 0.5–7.8)
HCT: 39.4 % (ref 35.8–46.3)
HGB: 12.6 g/dL (ref 11.7–15.4)
IMMATURE GRANULOCYTES: 0 % (ref 0.0–5.0)
LYMPHOCYTES: 25 % (ref 13–44)
MCH: 29.2 PG (ref 26.1–32.9)
MCHC: 32 g/dL (ref 31.4–35.0)
MCV: 91.4 FL (ref 79.6–97.8)
MONOCYTES: 8 % (ref 4.0–12.0)
MPV: 9.8 FL (ref 9.4–12.3)
NEUTROPHILS: 64 % (ref 43–78)
PLATELET: 289 10*3/uL (ref 150–450)
RBC: 4.31 M/uL (ref 4.05–5.2)
RDW: 12.9 % (ref 11.9–14.6)
WBC: 11.3 10*3/uL — ABNORMAL HIGH (ref 4.3–11.1)

## 2017-12-28 MED ORDER — OXYCODONE-ACETAMINOPHEN 5 MG-325 MG TAB
5-325 mg | ORAL | Status: DC
Start: 2017-12-28 — End: 2017-12-27

## 2017-12-28 MED ORDER — ACETAMINOPHEN 500 MG TAB
500 mg | ORAL | Status: AC
Start: 2017-12-28 — End: 2017-12-28
  Administered 2017-12-28: 04:00:00 via ORAL

## 2017-12-28 MED FILL — MAPAP EXTRA STRENGTH 500 MG TABLET: 500 mg | ORAL | Qty: 2

## 2017-12-28 NOTE — ED Provider Notes (Signed)
Patient complains of lower abdominal pain onset this afternoon following a BM. No diarrhea. No blood in stools. No fever. No urinary symptoms. Feels a heaviness in her pelvis with standing. Some discomfort up the left side of abdomen. No nausea or vomiting. Has had 3 vaginal deliveries and one C section. Has had a hysterectomy.            Past Medical History:   Diagnosis Date   ??? Abnormal uterine bleeding (AUB) 03/23/2014   ??? Adenomyosis    ??? Anxiety     no present treatment    ??? Cyst of right ovary 11/16/2016    [facility]: cyst was found with [place] and CT scan    ??? Enlarged uterus    ??? History of DVT (deep vein thrombosis) 03/23/2014   ??? Menorrhagia    ??? Psychiatric disorder    ??? 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 Unspecified adverse effect of anesthesia     has woken up after anesthesia "violent" in the past; pt states just due stressfull situations       Past Surgical History:   Procedure Laterality Date   ??? HX CERVICAL FUSION  09/2012 at [facility]    C6 - C7 with hardware; Dr. Kirstie Peri   ??? HX CESAREAN SECTION  1993   ??? HX COLONOSCOPY  12/2013   ??? HX DILATION AND CURETTAGE      due to miscarriage   ??? HX ENDOSCOPY  12/2013   ??? HX HYSTERECTOMY     ??? HX WISDOM TEETH EXTRACTION           Family History:   Problem Relation Age of Onset   ??? Hypertension Father    ??? Hypertension Mother    ??? Breast Cancer Maternal Grandmother 60   ??? Heart Disease Maternal Grandmother    ??? Hypertension Maternal Grandmother    ??? Diabetes Paternal Grandmother    ??? Heart Disease Paternal Grandmother    ??? Hypertension Paternal Grandmother        Social History     Socioeconomic History   ??? Marital status: MARRIED     Spouse name: Not on file   ??? Number of children: Not on file   ??? Years of education: Not on file   ??? Highest education level: Not on file   Occupational History   ??? Not on file   Social Needs   ??? Financial resource strain: Not on file   ??? Food insecurity:     Worry: Not on file     Inability: Not on file    ??? Transportation needs:     Medical: Not on file     Non-medical: Not on file   Tobacco Use   ??? Smoking status: Former Smoker     Packs/day: 0.50     Years: 6.00     Pack years: 3.00   ??? Smokeless tobacco: Never Used   ??? Tobacco comment: stopped in 2013   Substance and Sexual Activity   ??? Alcohol use: Yes     Comment: Rare   ??? Drug use: Yes     Types: Marijuana     Comment: occasional use of marijuana   ??? Sexual activity: Yes     Partners: Male     Birth control/protection: Surgical     Comment: partical hysterectomy    Lifestyle   ??? Physical activity:     Days per week: Not  on file     Minutes per session: Not on file   ??? Stress: Not on file   Relationships   ??? Social connections:     Talks on phone: Not on file     Gets together: Not on file     Attends religious service: Not on file     Active member of club or organization: Not on file     Attends meetings of clubs or organizations: Not on file     Relationship status: Not on file   ??? Intimate partner violence:     Fear of current or ex partner: Not on file     Emotionally abused: Not on file     Physically abused: Not on file     Forced sexual activity: Not on file   Other Topics Concern   ??? Not on file   Social History Narrative   ??? Not on file         ALLERGIES: Codeine    Review of Systems   Constitutional: Negative.    HENT: Negative.    Respiratory: Negative.    Cardiovascular: Negative.    Gastrointestinal: Positive for abdominal pain. Negative for anal bleeding, blood in stool, constipation, diarrhea, nausea, rectal pain and vomiting.   Genitourinary: Positive for pelvic pain. Negative for difficulty urinating and dysuria.   Musculoskeletal: Negative.    Neurological: Negative.        Vitals:    12/27/17 2152 12/28/17 0040   BP: 119/59 116/61   Pulse: 67 64   Resp: 16 16   Temp: 98.6 ??F (37 ??C) 98.6 ??F (37 ??C)   SpO2: 99% 98%   Weight: 90.7 kg (200 lb)    Height:  (1.6 m)             Physical Exam    Constitutional: She is oriented to person, place, and time. She appears well-developed and well-nourished.   HENT:   Head: Normocephalic.   Eyes: Pupils are equal, round, and reactive to light.   Neck: Normal range of motion.   Cardiovascular: Normal rate, regular rhythm, normal heart sounds and intact distal pulses.   Pulmonary/Chest: Effort normal and breath sounds normal.   Abdominal: Soft. Bowel sounds are normal.   Tenderness lower abdomen. No rebound or guarding. No mass. No hernia.    Genitourinary:   Genitourinary Comments: Pelvic. Normal vagina. Cystocele. No pelvic mass. Some generalized pelvic tenderness.    Musculoskeletal: She exhibits no edema or tenderness.   Neurological: She is alert and oriented to person, place, and time.   Skin: Skin is warm and dry.   Nursing note and vitals reviewed.       MDM  Number of Diagnoses or Management Options  Abdominal pain, LLQ (left lower quadrant):   Diagnosis management comments: Pelvic / lower abdominal pain  Tylenol po  Labs reviewed  Urine normal  Results and instructions discussed with patient and husband  Follow up with PCP  Return with new or worse symptoms.       Amount and/or Complexity of Data Reviewed  Clinical lab tests: ordered and reviewed  Review and summarize past medical records: yes    Patient Progress  Patient progress: stable         Procedures

## 2017-12-28 NOTE — ED Notes (Signed)
I have reviewed discharge instructions with the patient and spouse.  The patient and spouse verbalized understanding.    Patient left ED via Discharge Method: ambulatory to Home with spouse.    Opportunity for questions and clarification provided.       Patient given 0 scripts.         To continue your aftercare when you leave the hospital, you may receive an automated call from our care team to check in on how you are doing.  This is a free service and part of our promise to provide the best care and service to meet your aftercare needs.??? If you have questions, or wish to unsubscribe from this service please call [phone].  Thank you for Choosing our [facility].

## 2018-01-01 ENCOUNTER — Encounter: Admit: 2018-01-01 | Discharge: 2018-01-01 | Payer: BLUE CROSS/BLUE SHIELD | Primary: Family Medicine

## 2018-01-01 ENCOUNTER — Ambulatory Visit
Admit: 2018-01-01 | Discharge: 2018-01-01 | Payer: BLUE CROSS/BLUE SHIELD | Attending: Obstetrics & Gynecology | Primary: Family Medicine

## 2018-01-01 ENCOUNTER — Ambulatory Visit: Attending: Obstetrics & Gynecology | Primary: Family Medicine

## 2018-01-01 DIAGNOSIS — R102 Pelvic and perineal pain: Secondary | ICD-10-CM

## 2018-01-01 LAB — AMB POC URINALYSIS DIP STICK AUTO W/O MICRO
Bilirubin (UA POC): NEGATIVE
Blood (UA POC): NEGATIVE
Glucose (UA POC): NEGATIVE
Ketones (UA POC): NEGATIVE
Leukocyte esterase (UA POC): NEGATIVE
Nitrites (UA POC): NEGATIVE
Protein (UA POC): NEGATIVE
Specific gravity (UA POC): 1.02 (ref 1.001–1.035)
Urobilinogen (UA POC): 0.2 (ref 0.2–1)
pH (UA POC): 7 (ref 4.6–8.0)

## 2018-01-01 MED ORDER — TOLTERODINE SR 4 MG 24 HR CAP
4 mg | ORAL_CAPSULE | Freq: Every day | ORAL | 2 refills | Status: AC
Start: 2018-01-01 — End: ?

## 2018-01-01 NOTE — Progress Notes (Signed)
See us report

## 2018-01-01 NOTE — Progress Notes (Signed)
Marissa Perry  is a 42 y.o. female, G5 P4014.  Patient's last menstrual period was 03/01/2014 (exact date)., who is being seen for an ER follow up from 12/28/17 secondary to LLQ and abdominal pain. Symptoms have been gradually improving since she has not been as active, but worsens in the evening. She also complains of bladder prolapse. She states that she has a bulging feeling in the vaginal area.  Pt is currently using status post hysterectomy.  She is peeing on herself by the end of the day. She urinates hourly.   She leaks with coughing / sneezing.  She has been seeing her pcp for "what she thought was bladder infections."  No positive cultures that she knows of.   She has been on bladder anticholinergics in past but sometimes insurance would cover, etc.  She has not felt like having sex but started lexapro in the fall.  She has also been referred to GI because has pain when her bowel is moving.      Her ua today is negative.      Her [place] today shows:  Surgically absent uterus.  Small amount of free fluid in pelvis.  Right ovary wnl. Left ovary with 1.6cm simple cyst.  adnexae wnl.      HISTORY:    OB History     Gravida   5    Para   4    Term   4    Preterm        AB   1    Living   4       SAB   1    TAB        Ectopic        Molar        Multiple        Live Births   4              GYN History         Sexual History:   Social History     Substance and Sexual Activity   Sexual Activity Yes   ??? Partners: Male   ??? Birth control/protection: Surgical    Comment: Hysterectomy          has a past medical history of Abnormal uterine bleeding (AUB) (03/23/2014), Adenomyosis, Anxiety, Cyst of right ovary (11/16/2016), Enlarged uterus, History of DVT (deep vein thrombosis) (03/23/2014), Menorrhagia, Psychiatric disorder, Pulmonary embolism (HCC), Severe obesity (HCC) (01/01/2018), Thromboembolus (HCC) (9/14), and Unspecified adverse effect of anesthesia. She also has no past medical history of  Aneurysm (HCC), Coagulation disorder (HCC), Difficult intubation, Heart failure (HCC), Ill-defined condition, Malignant hyperthermia due to anesthesia, Nausea & vomiting, Pseudocholinesterase deficiency, or Unspecified sleep apnea. .    Past Surgical History:   Procedure Laterality Date   ??? HX CERVICAL FUSION  09/2012 at [facility]    C6 - C7 with hardware; Dr. Kirstie Peri   ??? HX CESAREAN SECTION  1993   ??? HX COLONOSCOPY  12/2013   ??? HX DILATION AND CURETTAGE      due to miscarriage   ??? HX ENDOSCOPY  12/2013   ??? HX TOTAL LAPAROSCOPIC HYSTERECTOMY     ??? HX WISDOM TEETH EXTRACTION         Her current meds are:    Current Outpatient Medications:   ???  tolterodine ER (DETROL LA) 4 mg ER capsule, Take 1 Cap by mouth daily. Indications: overactive bladder, Disp: 30 Cap, Rfl: 2  ???  dabigatran etexilate (  PRADAXA) 75 mg capsule, Take 75 mg by mouth every twelve (12) hours., Disp: , Rfl:   ???  escitalopram oxalate (LEXAPRO) 10 mg tablet, Take 10 mg by mouth daily., Disp: , Rfl:      ROS    PHYSICAL EXAM:  Visit Vitals  BP 94/60 (BP 1 Location: Left arm, BP Patient Position: Sitting)   Ht  (1.6 m)   Wt 197 lb 9.6 oz (89.6 kg)   LMP 03/01/2014 (Exact Date)   BMI 35.00 kg/m??     Physical Exam  General:  Well nourished well developed female   Heart rrr  Lungs cta b&s   Abdomen soft nontender with no rebound or guarding.  Speculum exam  - no defects in vaginal canal.  Speculum broken down and with valsalva - mild  - grade 2 cystocele. No rectocele.  No leakage of urine.    ASSESSMENT / PLAN:  Diagnoses and all orders for this visit:    1. Pelvic pain in female  -     US TRANSVAGINAL    2. Stress incontinence  -     AMB POC URINALYSIS DIP STICK AUTO W/O MICRO  -     REFERRAL TO UROGYNECOLOGY    3. Overflow incontinence of urine  -     AMB POC URINALYSIS DIP STICK AUTO W/O MICRO  -     REFERRAL TO UROGYNECOLOGY    4. Midline cystocele  -     AMB POC URINALYSIS DIP STICK AUTO W/O MICRO  -     REFERRAL TO UROGYNECOLOGY    Other orders   -     tolterodine ER (DETROL LA) 4 mg ER capsule; Take 1 Cap by mouth daily. Indications: overactive bladder      Will place her back on detrol la until she sees urogyn.  Maybe sling candidate.  Maybe pt has IC?  Discussed that has simple cyst on left - maybe had cyst that ruptured and now fluid is reabsorbing.  Possible candidate for anterior repair.  Pt is aware of plan.      Alya Smaltz, DO

## 2018-01-23 ENCOUNTER — Ambulatory Visit
Admit: 2018-01-23 | Discharge: 2018-01-23 | Payer: BLUE CROSS/BLUE SHIELD | Attending: Obstetrics & Gynecology | Primary: Family Medicine

## 2018-01-23 ENCOUNTER — Encounter: Payer: BLUE CROSS/BLUE SHIELD | Attending: Family | Primary: Family Medicine

## 2018-01-23 ENCOUNTER — Ambulatory Visit: Attending: Obstetrics & Gynecology | Primary: Family Medicine

## 2018-01-23 DIAGNOSIS — N8110 Cystocele, unspecified: Secondary | ICD-10-CM

## 2018-01-23 LAB — AMB POC PVR, MEAS,POST-VOID RES,US,NON-IMAGING
PVR POC: 7 cc
PVR: 7 cc

## 2018-01-23 LAB — AMB POC URINALYSIS DIP STICK MANUAL W/O MICRO
Bilirubin (UA POC): NEGATIVE
Bilirubin, Urine, POC: NEGATIVE
Blood (UA POC): NEGATIVE
Blood (UA POC): NEGATIVE
Glucose (UA POC): NEGATIVE
Glucose, Urine, POC: NEGATIVE
Ketones (UA POC): NEGATIVE
Ketones, Urine, POC: NEGATIVE
Leukocyte Esterase, Urine, POC: NEGATIVE
Leukocyte esterase (UA POC): NEGATIVE
Nitrite, Urine, POC: NEGATIVE
Nitrites (UA POC): NEGATIVE
Protein (UA POC): NEGATIVE
Protein, Urine, POC: NEGATIVE
Specific Gravity, Urine, POC: 1.02 NA (ref 1.001–1.035)
Specific gravity (UA POC): 1.02 (ref 1.001–1.035)
Urobilinogen (UA POC): 0.2 (ref 0.2–1)
Urobilinogen, POC: 0.2 (ref 0.2–1)
pH (UA POC): 7 (ref 4.6–8.0)
pH, Urine, POC: 7 NA (ref 4.6–8.0)

## 2018-01-23 MED ORDER — METHENAM 118 MG-M.BLUE 10 MG-S.PHOS 40.8 MG-P.SALIC 36 MG-HYOS CAPSULE
ORAL_CAPSULE | Freq: Four times a day (QID) | ORAL | 12 refills | Status: AC
Start: 2018-01-23 — End: ?

## 2018-01-23 NOTE — Assessment & Plan Note (Signed)
We discussed the epidemiology, pathogenesis and etiology of pelvic organ prolapse. We discussed the potential risk factors which include genetics and environmental factors, such as childbirth, aging, menopause, straining, and previous surgery. I offered her management options which included nothing, pessary, and surgery.     I did not see evidence of POP today. We are going to hold off on any treatment or management at this time. We will reevaluate in the future of her symptoms return.

## 2018-01-23 NOTE — Patient Instructions (Addendum)
Prolapse  We discussed the epidemiology, pathogenesis and etiology of pelvic organ prolapse. We discussed the potential risk factors which include genetics and environmental factors, such as childbirth, aging, menopause, straining, and previous surgery. I offered her management options which included nothing, pessary, and surgery.     We will reevaluate if symptoms return    Stress Urinary Incontinence  We discussed the differential diagnosis of urinary incontinence. We also discussed the pathogenesis and etiology of stress urinary incontinence. I explained the epidemiology of incontinence. I offered her options which include nothing, physical therapy, barrier treatment, and surgery.    We are going to Try PT:  We discussed the purpose of physical therapy which is to strengthen the pelvic floor muscles and teach proper coordination of those muscles. I described the anatomy of those muscles involved and their relationship to the end-organs in the pelvis. I described therapy techniques which include a combination of therapeutic exercise, biofeedback, neuromuscular re-education, home programs, and electrical stimulation, as well as therapeutic massage and ultrasound for pain.    Interstitial Cystitis/ Bladder Pain Syndrome  We discussed her new diagnosis of interstitial cystitis/painful bladder syndrome (IC/PBS). We discussed the different theories regarding the pathogenesis. We discussed management options which include medical management, dietary modification, bladder instillations, and other treatments.    Bladder Diet    Physical Therapy    Urogesic blue/ Uribel: Treats painful urination, irritating symptoms of the urinary tract due to urinary tract infections or IC. Urogesic blue/ Ninfa Linden is a [place] ry antiseptic and antispasmodic combination. It works by helping to decrease inflammation, reducing muscle spasms.    Prelief: Helps neutralize the acid in foods. Take 2 with every meal,  especially with foods you know will have acid in them. This is purchased over the counter, or at [email or web address]    Bladder Ease: This is a supplement that helps to decrease bladder inflammation. You make take 1 by mouth twice daily with marshmallow root and an antispasmodic like Urogesic Blue/ Uribel.This is purchased over the counter or [employer].com    Marshmallow Root: This has a soothing effect and can help the inflamed and irritated tissue of not only the respiratory organs, but also the digestive tract and urinary organs. The root is also said to help increase the secretion and flow of urine and is considered to be a good diuretic. This is purchased over the counter.    Bladder instillations  Hydrodistention    Treatment for IC Flare    ? May take OTC Bladder Ease, or Marsh Root (tea or tablets)  ? Try drinking a ???shot??? of Apple cider vinegar, or Tsp of baking soda in 5oz water.  ? Avoid certain foods that may flare up the bladder such as, Choc, spicy foods or herbs, tomato sauce, citrus foods or drinks, fruits- lemons, oranges, pineapples, kiwis. Beer, wine, coffee, tea.      High Tone Pelvic Floor Dysfunction  We discussed the purpose of physical therapy which is to strengthen the pelvic floor muscles and teach proper coordination of those muscles. I described the anatomy of those muscles involved and their relationship to the end-organs in the pelvis. I described therapy techniques which include a combination of therapeutic exercise, biofeedback, neuromuscular re-education, home programs, and electrical stimulation, as well as therapeutic massage and ultrasound for pain.

## 2018-01-23 NOTE — Assessment & Plan Note (Signed)
We discussed the purpose of physical therapy which is to strengthen the pelvic floor muscles and teach proper coordination of those muscles. I described the anatomy of those muscles involved and their relationship to the end-organs in the pelvis. I described therapy techniques which include a combination of therapeutic exercise, biofeedback, neuromuscular re-education, home programs, and electrical stimulation, as well as therapeutic massage and ultrasound for pain.

## 2018-01-23 NOTE — Assessment & Plan Note (Signed)
We discussed the differential diagnosis of urinary incontinence. We also discussed the pathogenesis and etiology of stress urinary incontinence. I explained the epidemiology of incontinence. I offered her options which include nothing, physical therapy, barrier treatment, and surgery    We are going to start with PT:  We discussed the purpose of physical therapy which is to strengthen the pelvic floor muscles and teach proper coordination of those muscles. I described the anatomy of those muscles involved and their relationship to the end-organs in the pelvis. I described therapy techniques which include a combination of therapeutic exercise, biofeedback, neuromuscular re-education, home programs, and electrical stimulation, as well as therapeutic massage and ultrasound for pain.

## 2018-01-23 NOTE — Progress Notes (Addendum)
[facility] UROGYNECOLOGY  [address]  [address]  [place] [address]  Dept: [phone]        PCP:  Wandalee Ferdinand, MD    01/23/2018    Chief Complaint   Patient presents with   ??? New Patient     Prolapse     HPI:  I am being asked to see this patient in consultation by Dr. Steva Ready for prolapse and incontinence. She has had prolapse for about a month. She went to the ER 12/27/17 because she had been on the Trampoline with her son and felt a pop and then a few days after she had a large BM and felt something like a lemon in her vagina. She does not have to splint. It causes pressure. She has not tried a pessary or had surgery for this in the past.     She leaks urine for months. She leaks during the day. She leaks with cough, laugh, sneeze. She does not leak with urge. She wears pads and changes them 2 throughout the day but more for hygiene. She was placed on Detrol  XL by Dr. Steva Ready and it is helping. She feels that when she leaks she is not leaking the same volume as before, its improved.  In the past she has tried Vesicare , Sanctura XL  and Ditropan XL .  She has not done PT or surgery for this.     She has also had UTI like symptoms and all of the cx have come back negative.     She voids 10+ times during the day, she wakes up 5 times in the middle of the night to void (since being on detrol she voids 3 times at night and still about the same during the day.   She has 7 BM per day and she does not strain    3-4 cups of coffee per day  No sweeteners  She does not drink alcohol    She is sexually active with men and she does not have leaking with intercourse- although may be doing so in the last 6 months.           Current Outpatient Medications:   ???  mth-me blue-sod phos-phsal-hyo (URIBEL) 118-10-40.8-36 mg cap capsule, Take 1 Cap by mouth four (4) times daily., Disp: 120 Cap, Rfl: 12  ???  tolterodine ER (DETROL LA) 4 mg ER capsule, Take 1 Cap by mouth daily.  Indications: overactive bladder, Disp: 30 Cap, Rfl: 2  ???  dabigatran etexilate (PRADAXA) 75 mg capsule, Take 75 mg by mouth every twelve (12) hours., Disp: , Rfl:   ???  escitalopram oxalate (LEXAPRO) 10 mg tablet, Take 10 mg by mouth daily., Disp: , Rfl:     Allergies   Allergen Reactions   ??? Codeine Nausea Only     Blurry vision       Past Medical History:   Diagnosis Date   ??? Abnormal uterine bleeding (AUB) 03/23/2014   ??? Adenomyosis    ??? Anxiety     no present treatment    ??? Cyst of right ovary 11/16/2016    [facility]: cyst was found with [place] and CT scan    ??? Enlarged uterus    ??? History of DVT (deep vein thrombosis) 03/23/2014   ??? Menorrhagia    ??? Psychiatric disorder    ??? Pulmonary embolism (HCC)    ??? Severe obesity (HCC) 01/01/2018   ??? Thromboembolus (HCC) 9/14  LLE; pt takes xarelto daily   ??? Unspecified adverse effect of anesthesia     has woken up after anesthesia "violent" in the past; pt states just due stressfull situations       OB History   Gravida Para Term Preterm AB Living   SAB TAB Ectopic Molar Multiple Live Births   1         4      # Outcome Date GA Lbr Len/2nd Weight Sex Delivery Anes PTL Lv   5 Term 07/26/06    M Vag-Spont   LIV   4 Term 04/04/03    F Vag-Spont   LIV   3 Term 09/25/93    M Vag-Spont   LIV   2 Term 08/19/92    M CS-LTranv   LIV   1 SAB               Birth Comments: D&C      Obstetric Comments   3 vaginal deliveries   1 C-Section       Past Surgical History:   Procedure Laterality Date   ??? HX CERVICAL FUSION  09/2012 at [facility]    C6 - C7 with hardware; Dr. Kirstie Peri   ??? HX CESAREAN SECTION  1993   ??? HX COLONOSCOPY  12/2013   ??? HX DILATION AND CURETTAGE      due to miscarriage   ??? HX ENDOSCOPY  12/2013   ??? HX TOTAL LAPAROSCOPIC HYSTERECTOMY     ??? HX WISDOM TEETH EXTRACTION         Family History   Problem Relation Age of Onset   ??? Hypertension Father    ??? Hypertension Mother    ??? No Known Problems Sister    ??? No Known Problems Brother    ??? No Known Problems Sister     ??? No Known Problems Sister    ??? No Known Problems Brother    ??? Breast Cancer Maternal Grandmother 60   ??? Heart Disease Maternal Grandmother    ??? Hypertension Maternal Grandmother    ??? Alzheimer Maternal Grandmother    ??? Diabetes Paternal Grandmother    ??? Heart Disease Paternal Grandmother    ??? Hypertension Paternal Grandmother    ??? Heart Attack Maternal Grandfather    ??? Other Paternal Grandfather         Heart attack or blood clot       Social History     Socioeconomic History   ??? Marital status: MARRIED     Spouse name: Not on file   ??? Number of children: Not on file   ??? Years of education: Not on file   ??? Highest education level: Not on file   Tobacco Use   ??? Smoking status: Former Smoker     Packs/day: 0.50     Years: 6.00     Pack years: 3.00   ??? Smokeless tobacco: Never Used   ??? Tobacco comment: stopped in 2013   Substance and Sexual Activity   ??? Alcohol use: Yes     Comment: Rare   ??? Drug use: Yes     Types: Marijuana     Comment: occasional use of marijuana   ??? Sexual activity: Yes     Partners: Male     Birth control/protection: Surgical     Comment: Hysterectomy         Review of Systems:    Constitutional:  Negative for fatigue and fever.   Skin: Negative for rash and itching.   HENT: Negative for hearing loss and vertigo.    Respiratory: Negative for cough and shortness of breath.    Cardiovascular:  Negative for chest pain, leg swelling and palpitations.    Gastrointestinal: Negative for abdominal pain, anal bleeding, constipation, diarrhea and hemorrhoids.   Genitourinary: Positive for enuresis, frequency and urgency. Negative for difficulty urinating, dyspareunia, dysuria, genital sores, hematuria, menstrual problem, pelvic pain, pelvic pressure, decreased urine volume, vaginal bleeding, vaginal discharge, vaginal pain, vaginal burning, vaginal itching, vaginal odor and vaginal lesion.   Neurological: Negative for dizziness, blackouts and headaches.    Psychiatric/Behavioral: Negative for behavioral problem, nervous/anxious and insomnia.    Endocrine: Negative for polydipsia and polyuria.   Female Endocrine: Positive for decreased libido.  Negative for vaginal dryness and pain with intercourse.          Results for orders placed or performed in visit on 01/23/18   AMB POC URINALYSIS DIP STICK MANUAL W/O MICRO   Result Value Ref Range    Color (UA POC) Yellow     Clarity (UA POC) Clear     Glucose (UA POC) Negative Negative    Bilirubin (UA POC) Negative Negative    Ketones (UA POC) Negative Negative    Specific gravity (UA POC) 1.020 1.001 - 1.035    Blood (UA POC) Negative Negative    pH (UA POC) 7.0 4.6 - 8.0    Protein (UA POC) Negative Negative    Urobilinogen (UA POC) 0.2 mg/dL 0.2 - 1    Nitrites (UA POC) Negative Negative    Leukocyte esterase (UA POC) Negative Negative   AMB POC PVR, MEAS,POST-VOID RES,US,NON-IMAGING   Result Value Ref Range    PVR 7 cc         Visit Vitals  BP 104/70 (BP 1 Location: Right arm, BP Patient Position: Sitting)   Ht  (1.6 m)   Wt 200 lb 3.2 oz (90.8 kg)   LMP 03/01/2014 (Exact Date)   BMI 35.46 kg/m??       Physical Exam   Constitutional: She is oriented to person, place, and time. Vital signs are normal. She appears well-developed and well-nourished. She is cooperative. No distress.   HENT:   Head: Normocephalic and atraumatic.   Neck: Trachea normal. No thyroid mass present.   Cardiovascular: Normal rate and normal heart sounds.   Pulmonary/Chest: Effort normal. No accessory muscle usage. No respiratory distress.   Abdominal: Soft. Normal appearance. She exhibits no distension and no mass. There is no hepatosplenomegaly. There is no tenderness. There is no rebound and no guarding. No hernia.   Genitourinary: Pelvic exam was performed with patient supine.   Musculoskeletal: She exhibits no edema or tenderness.   Lymphadenopathy:     She has no cervical adenopathy.     She has no axillary adenopathy.    Neurological: She is alert and oriented to person, place, and time.   Skin: Skin is warm and dry. No lesion and no rash noted. No erythema.   Psychiatric: She has a normal mood and affect. Her speech is normal and behavior is normal. Judgment and thought content normal. Cognition and memory are normal. She does not express impulsivity or inappropriate judgment.   Vitals reviewed.      Female Genitourinary   Vulva: - Normal. No lesions  Bartholin's Gland - Bilateral - Normal, nontender  Skenes Gland- Bilateral-Normal, nontender   Clitoris - Normal.  Introitus: Characteristics - Normal.   Urethral Meatus: - Normal appearing, normal size, no lesions, no prolapse  Urethra: No masses, no tenderness  Vagina:  No atrophy, no discharge, no lesions  Cervix: - absent  Uterus: -  absent  Adnexa: - No masses palpated, no tenderness  Bladder - + tenderness, no masses palpated  Perineum - no lesions    Rectal   Anorectal Exam: - normal sphincter tone; no hemorrhoids and no masses or lesions of the perineum, anus or rectum.         POP-Q: (Pelvic Organ Prolapse - Quantification Exam):    -3 Aa -3 Ba -6 C   3 gh 3 pb 8 tvl   -3 Ap -3 Bp X D       ICS Stage            Anterior:  Stage 0  Posterior:  Stage 0  Apical:  Stage 0         Pelvic floor muscles: Tender Spasm   Contraction    R. Puborectalis: Y 4 /5  4 /5   L. Puborectalis: Y 3 /5  4 /5   R. Pubococcyg Y 4 /5  4 /5   L. Pubococcyg Y 3 /5  4 /5   R. Ileococcyg: Y 4 /5  4 /5   L. Ileococcyg: Y 3 /5  4 /5   R. Obturator Int: Y 5 /5  4 /5   L. Obturator Int: Y 3 /5  4 /5   R. Coccygeus: Y 5 /5  4 /5   L. Coccygeus: Y 3 /5  4 /5         Supine Stress Test of SUI:  Negative     Neurological Exam:   Sensorineural Exam:    Bulvocavernosus reflex:  Normal    Anal Wink:  Normal       Diagnoses and all orders for this visit:    1. Female bladder prolapse  Assessment & Plan:  We discussed the epidemiology, pathogenesis and etiology of pelvic organ  prolapse. We discussed the potential risk factors which include genetics and environmental factors, such as childbirth, aging, menopause, straining, and previous surgery. I offered her management options which included nothing, pessary, and surgery.     I did not see evidence of POP today. We are going to hold off on any treatment or management at this time. We will reevaluate in the future of her symptoms return.     Orders:  -     AMB POC URINALYSIS DIP STICK MANUAL W/O MICRO  -     AMB POC PVR, MEAS,POST-VOID RES,US,NON-IMAGING    2. High-tone pelvic floor dysfunction  Assessment & Plan:  We discussed the purpose of physical therapy which is to strengthen the pelvic floor muscles and teach proper coordination of those muscles. I described the anatomy of those muscles involved and their relationship to the end-organs in the pelvis. I described therapy techniques which include a combination of therapeutic exercise, biofeedback, neuromuscular re-education, home programs, and electrical stimulation, as well as therapeutic massage and ultrasound for pain.      Orders:  -     REFERRAL TO PHYSICAL THERAPY    3. SUI (stress urinary incontinence, female)  Assessment & Plan:  We discussed the differential diagnosis of urinary incontinence. We also discussed the pathogenesis and etiology of stress urinary incontinence. I explained the epidemiology of incontinence. I offered her options which include nothing, physical therapy, barrier treatment, and  surgery    We are going to start with PT:  We discussed the purpose of physical therapy which is to strengthen the pelvic floor muscles and teach proper coordination of those muscles. I described the anatomy of those muscles involved and their relationship to the end-organs in the pelvis. I described therapy techniques which include a combination of therapeutic exercise, biofeedback, neuromuscular re-education, home programs, and electrical stimulation, as well as  therapeutic massage and ultrasound for pain.      Orders:  -     REFERRAL TO PHYSICAL THERAPY    4. IC (interstitial cystitis)  Assessment & Plan:  May cont with Detrol at this time. The goal is to get her off of this medication.     We discussed her new diagnosis of interstitial cystitis/painful bladder syndrome (IC/PBS). We discussed the different theories regarding the pathogenesis. We discussed management options which include medical management, dietary modification, bladder instillations, and other treatments. For now she will start with    Bladder Diet  Physical Therapy  Urogesic Blue  Prelief  Bladder Ease  Marshmallow Root  Bladder Instillations  Hydrodistention of the bladder for both diagnostic evaluation and therapeutic effect.    Treatment for IC Flare    ? May take OTC Bladder Ease, or Marsh Root (tea or tablets)  ? Try drinking a ???shot??? of Apple cider vinegar, or Tsp of baking soda in 5oz water.  ? Avoid certain foods that may flare up the bladder such as, Choc, spicy foods or herbs, tomato sauce, citrus foods or drinks, fruits- lemons, oranges, pineapples, kiwis. Beer, wine, coffee, tea.  ? Bladder Instillations        Orders:  -     REFERRAL TO PHYSICAL THERAPY    Other orders  -     mth-me blue-sod phos-phsal-hyo (URIBEL) 118-10-40.8-36 mg cap capsule; Take 1 Cap by mouth four (4) times daily.      Follow-up and Dispositions    ?? Return in about 2 months (around 03/25/2018), or if symptoms worsen or fail to improve, for IC/HTPF.       We spent 60 minutes of face to face time today in the office and greater than >50% of this time was spent counseling the patient and coordinating care.        Edmund Hilda, DO

## 2018-01-23 NOTE — Assessment & Plan Note (Signed)
May cont with Detrol at this time. The goal is to get her off of this medication.     We discussed her new diagnosis of interstitial cystitis/painful bladder syndrome (IC/PBS). We discussed the different theories regarding the pathogenesis. We discussed management options which include medical management, dietary modification, bladder instillations, and other treatments. For now she will start with    Bladder Diet  Physical Therapy  Urogesic Blue  Prelief  Bladder Ease  Marshmallow Root  Bladder Instillations  Hydrodistention of the bladder for both diagnostic evaluation and therapeutic effect.    Treatment for IC Flare    ? May take OTC Bladder Ease, or Marsh Root (tea or tablets)  ? Try drinking a ???shot??? of Apple cider vinegar, or Tsp of baking soda in 5oz water.  ? Avoid certain foods that may flare up the bladder such as, Choc, spicy foods or herbs, tomato sauce, citrus foods or drinks, fruits- lemons, oranges, pineapples, kiwis. Beer, wine, coffee, tea.  ? Bladder Instillations

## 2018-01-23 NOTE — Assessment & Plan Note (Signed)
May cont with Detrol at this time. The goal is to get her off of this medication.     We discussed her new diagnosis of interstitial cystitis/painful bladder syndrome (IC/PBS). We discussed the different theories regarding the pathogenesis. We discussed management options which include medical management, dietary modification, bladder instillations, and other treatments. For now she will start with    Bladder Diet  Physical Therapy  Urogesic Blue  Prelief  Bladder Ease  Marshmallow Root  Bladder Instillations  Hydrodistention of the bladder for both diagnostic evaluation and therapeutic effect.    Treatment for IC Flare    ??? May take OTC Bladder Ease, or Marsh Root (tea or tablets)  ??? Try drinking a ???shot??? of Apple cider vinegar, or Tsp of baking soda in 5oz water.  ??? Avoid certain foods that may flare up the bladder such as, Choc, spicy foods or herbs, tomato sauce, citrus foods or drinks, fruits- lemons, oranges, pineapples, kiwis. Beer, wine, coffee, tea.  ??? Bladder Instillations

## 2018-01-23 NOTE — Assessment & Plan Note (Signed)
We discussed the epidemiology, pathogenesis and etiology of pelvic organ prolapse. We discussed the potential risk factors which include genetics and environmental factors, such as childbirth, aging, menopause, straining, and previous surgery. I offered her management options which included nothing, pessary, and surgery.     I did not see evidence of POP today. We are going to hold off on any treatment or management at this time. We will reevaluate in the future of her symptoms return.

## 2018-01-23 NOTE — Progress Notes (Signed)
[facility] UROGYNECOLOGY  [address]  [address]  [place] [address]  Dept: [phone]        PCP:  Wandalee Ferdinand, MD    01/23/2018    Chief Complaint   Patient presents with   ??? New Patient     Prolapse     HPI:  I am being asked to see this patient in consultation by Dr. Steva Ready for prolapse and incontinence. She has had prolapse for about a month. She went to the ER 12/27/17 because she had been on the Trampoline with her son and felt a pop and then a few days after she had a large BM and felt something like a lemon in her vagina. She does not have to splint. It causes pressure. She has not tried a pessary or had surgery for this in the past.     She leaks urine for months. She leaks during the day. She leaks with cough, laugh, sneeze. She does not leak with urge. She wears pads and changes them 2 throughout the day but more for hygiene. She was placed on Detrol 4mg  XL by Dr. Steva Ready and it is helping. She feels that when she leaks she is not leaking the same volume as before, its improved.  In the past she has tried Vesicare 5mg , Sanctura XL 60mg  and Ditropan XL 5mg .  She has not done PT or surgery for this.     She has also had UTI like symptoms and all of the cx have come back negative.     She voids 10+ times during the day, she wakes up 5 times in the middle of the night to void (since being on detrol she voids 3 times at night and still about the same during the day.   She has 7 BM per day and she does not strain    3-4 cups of coffee per day  No sweeteners  She does not drink alcohol    She is sexually active with men and she does not have leaking with intercourse- although may be doing so in the last 6 months.           Current Outpatient Medications:   ???  mth-me blue-sod phos-phsal-hyo (URIBEL) 118-10-40.8-36 mg cap capsule, Take 1 Cap by mouth four (4) times daily., Disp: 120 Cap, Rfl: 12  ???  tolterodine ER (DETROL LA) 4 mg ER capsule, Take 1 Cap by mouth daily. Indications: overactive bladder,  Disp: 30 Cap, Rfl: 2  ???  dabigatran etexilate (PRADAXA) 75 mg capsule, Take 75 mg by mouth every twelve (12) hours., Disp: , Rfl:   ???  escitalopram oxalate (LEXAPRO) 10 mg tablet, Take 10 mg by mouth daily., Disp: , Rfl:     Allergies   Allergen Reactions   ??? Codeine Nausea Only     Blurry vision       Past Medical History:   Diagnosis Date   ??? Abnormal uterine bleeding (AUB) 03/23/2014   ??? Adenomyosis    ??? Anxiety     no present treatment    ??? Cyst of right ovary 11/16/2016    [facility]: cyst was found with [place] and CT scan    ??? Enlarged uterus    ??? History of DVT (deep vein thrombosis) 03/23/2014   ??? Menorrhagia    ??? Psychiatric disorder    ??? Pulmonary embolism (HCC)    ??? Severe obesity (HCC) 01/01/2018   ??? Thromboembolus (HCC) 9/14  LLE; pt takes xarelto daily   ??? Unspecified adverse effect of anesthesia     has woken up after anesthesia "violent" in the past; pt states just due stressfull situations       OB History   Gravida Para Term Preterm AB Living   SAB TAB Ectopic Molar Multiple Live Births   1         4      # Outcome Date GA Lbr Len/2nd Weight Sex Delivery Anes PTL Lv   5 Term 07/26/06    M Vag-Spont   LIV   4 Term 04/04/03    F Vag-Spont   LIV   3 Term 09/25/93    M Vag-Spont   LIV   2 Term 08/19/92    M CS-LTranv   LIV   1 SAB               Birth Comments: D&C      Obstetric Comments   3 vaginal deliveries   1 C-Section       Past Surgical History:   Procedure Laterality Date   ??? HX CERVICAL FUSION  09/2012 at [facility]    C6 - C7 with hardware; Dr. Kirstie Peri   ??? HX CESAREAN SECTION  1993   ??? HX COLONOSCOPY  12/2013   ??? HX DILATION AND CURETTAGE      due to miscarriage   ??? HX ENDOSCOPY  12/2013   ??? HX TOTAL LAPAROSCOPIC HYSTERECTOMY     ??? HX WISDOM TEETH EXTRACTION         Family History   Problem Relation Age of Onset   ??? Hypertension Father    ??? Hypertension Mother    ??? No Known Problems Sister    ??? No Known Problems Brother    ??? No Known Problems Sister    ??? No Known Problems Sister    ??? No  Known Problems Brother    ??? Breast Cancer Maternal Grandmother 60   ??? Heart Disease Maternal Grandmother    ??? Hypertension Maternal Grandmother    ??? Alzheimer Maternal Grandmother    ??? Diabetes Paternal Grandmother    ??? Heart Disease Paternal Grandmother    ??? Hypertension Paternal Grandmother    ??? Heart Attack Maternal Grandfather    ??? Other Paternal Grandfather         Heart attack or blood clot       Social History     Socioeconomic History   ??? Marital status: MARRIED     Spouse name: Not on file   ??? Number of children: Not on file   ??? Years of education: Not on file   ??? Highest education level: Not on file   Tobacco Use   ??? Smoking status: Former Smoker     Packs/day: 0.50     Years: 6.00     Pack years: 3.00   ??? Smokeless tobacco: Never Used   ??? Tobacco comment: stopped in 2013   Substance and Sexual Activity   ??? Alcohol use: Yes     Comment: Rare   ??? Drug use: Yes     Types: Marijuana     Comment: occasional use of marijuana   ??? Sexual activity: Yes     Partners: Male     Birth control/protection: Surgical     Comment: Hysterectomy         Review of Systems:    Constitutional:  Negative for fatigue and fever.   Skin: Negative for rash and itching.   HENT: Negative for hearing loss and vertigo.    Respiratory: Negative for cough and shortness of breath.    Cardiovascular:  Negative for chest pain, leg swelling and palpitations.    Gastrointestinal: Negative for abdominal pain, anal bleeding, constipation, diarrhea and hemorrhoids.   Genitourinary: Positive for enuresis, frequency and urgency. Negative for difficulty urinating, dyspareunia, dysuria, genital sores, hematuria, menstrual problem, pelvic pain, pelvic pressure, decreased urine volume, vaginal bleeding, vaginal discharge, vaginal pain, vaginal burning, vaginal itching, vaginal odor and vaginal lesion.   Neurological: Negative for dizziness, blackouts and headaches.   Psychiatric/Behavioral: Negative for behavioral problem, nervous/anxious and insomnia.     Endocrine: Negative for polydipsia and polyuria.   Female Endocrine: Positive for decreased libido.  Negative for vaginal dryness and pain with intercourse.          Results for orders placed or performed in visit on 01/23/18   AMB POC URINALYSIS DIP STICK MANUAL W/O MICRO   Result Value Ref Range    Color (UA POC) Yellow     Clarity (UA POC) Clear     Glucose (UA POC) Negative Negative    Bilirubin (UA POC) Negative Negative    Ketones (UA POC) Negative Negative    Specific gravity (UA POC) 1.020 1.001 - 1.035    Blood (UA POC) Negative Negative    pH (UA POC) 7.0 4.6 - 8.0    Protein (UA POC) Negative Negative    Urobilinogen (UA POC) 0.2 mg/dL 0.2 - 1    Nitrites (UA POC) Negative Negative    Leukocyte esterase (UA POC) Negative Negative   AMB POC PVR, MEAS,POST-VOID RES,US,NON-IMAGING   Result Value Ref Range    PVR 7 cc         Visit Vitals  BP 104/70 (BP 1 Location: Right arm, BP Patient Position: Sitting)   Ht  (1.6 m)   Wt 200 lb 3.2 oz (90.8 kg)   LMP 03/01/2014 (Exact Date)   BMI 35.46 kg/m??       Physical Exam   Constitutional: She is oriented to person, place, and time. Vital signs are normal. She appears well-developed and well-nourished. She is cooperative. No distress.   HENT:   Head: Normocephalic and atraumatic.   Neck: Trachea normal. No thyroid mass present.   Cardiovascular: Normal rate and normal heart sounds.   Pulmonary/Chest: Effort normal. No accessory muscle usage. No respiratory distress.   Abdominal: Soft. Normal appearance. She exhibits no distension and no mass. There is no hepatosplenomegaly. There is no tenderness. There is no rebound and no guarding. No hernia.   Genitourinary: Pelvic exam was performed with patient supine.   Musculoskeletal: She exhibits no edema or tenderness.   Lymphadenopathy:     She has no cervical adenopathy.     She has no axillary adenopathy.   Neurological: She is alert and oriented to person, place, and time.   Skin: Skin is warm and dry. No lesion  and no rash noted. No erythema.   Psychiatric: She has a normal mood and affect. Her speech is normal and behavior is normal. Judgment and thought content normal. Cognition and memory are normal. She does not express impulsivity or inappropriate judgment.   Vitals reviewed.      Female Genitourinary   Vulva: - Normal. No lesions  Bartholin's Gland - Bilateral - Normal, nontender  Skenes Gland- Bilateral-Normal, nontender   Clitoris - Normal.  Introitus: Characteristics - Normal.   Urethral Meatus: - Normal appearing, normal size, no lesions, no prolapse  Urethra: No masses, no tenderness  Vagina:  No atrophy, no discharge, no lesions  Cervix: - absent  Uterus: -  absent  Adnexa: - No masses palpated, no tenderness  Bladder - + tenderness, no masses palpated  Perineum - no lesions    Rectal   Anorectal Exam: - normal sphincter tone; no hemorrhoids and no masses or lesions of the perineum, anus or rectum.         POP-Q: (Pelvic Organ Prolapse - Quantification Exam):    -3 Aa -3 Ba -6 C   3 gh 3 pb 8 tvl   -3 Ap -3 Bp X D       ICS Stage            Anterior:  Stage 0  Posterior:  Stage 0  Apical:  Stage 0         Pelvic floor muscles: Tender Spasm   Contraction    R. Puborectalis: Y 4 /5  4 /5   L. Puborectalis: Y 3 /5  4 /5   R. Pubococcyg Y 4 /5  4 /5   L. Pubococcyg Y 3 /5  4 /5   R. Ileococcyg: Y 4 /5  4 /5   L. Ileococcyg: Y 3 /5  4 /5   R. Obturator Int: Y 5 /5  4 /5   L. Obturator Int: Y 3 /5  4 /5   R. Coccygeus: Y 5 /5  4 /5   L. Coccygeus: Y 3 /5  4 /5         Supine Stress Test of SUI:  Negative     Neurological Exam:   Sensorineural Exam:    Bulvocavernosus reflex:  Normal    Anal Wink:  Normal       Diagnoses and all orders for this visit:    1. Female bladder prolapse  Assessment & Plan:  We discussed the epidemiology, pathogenesis and etiology of pelvic organ prolapse. We discussed the potential risk factors which include genetics and environmental factors, such as childbirth, aging, menopause,  straining, and previous surgery. I offered her management options which included nothing, pessary, and surgery.     I did not see evidence of POP today. We are going to hold off on any treatment or management at this time. We will reevaluate in the future of her symptoms return.     Orders:  -     AMB POC URINALYSIS DIP STICK MANUAL W/O MICRO  -     AMB POC PVR, MEAS,POST-VOID RES,US,NON-IMAGING    2. High-tone pelvic floor dysfunction  Assessment & Plan:  We discussed the purpose of physical therapy which is to strengthen the pelvic floor muscles and teach proper coordination of those muscles. I described the anatomy of those muscles involved and their relationship to the end-organs in the pelvis. I described therapy techniques which include a combination of therapeutic exercise, biofeedback, neuromuscular re-education, home programs, and electrical stimulation, as well as therapeutic massage and ultrasound for pain.      Orders:  -     REFERRAL TO PHYSICAL THERAPY    3. SUI (stress urinary incontinence, female)  Assessment & Plan:  We discussed the differential diagnosis of urinary incontinence. We also discussed the pathogenesis and etiology of stress urinary incontinence. I explained the epidemiology of incontinence. I offered her options which include nothing, physical therapy, barrier treatment, and  surgery    We are going to start with PT:  We discussed the purpose of physical therapy which is to strengthen the pelvic floor muscles and teach proper coordination of those muscles. I described the anatomy of those muscles involved and their relationship to the end-organs in the pelvis. I described therapy techniques which include a combination of therapeutic exercise, biofeedback, neuromuscular re-education, home programs, and electrical stimulation, as well as therapeutic massage and ultrasound for pain.      Orders:  -     REFERRAL TO PHYSICAL THERAPY    4. IC (interstitial cystitis)  Assessment & Plan:  May  cont with Detrol at this time. The goal is to get her off of this medication.     We discussed her new diagnosis of interstitial cystitis/painful bladder syndrome (IC/PBS). We discussed the different theories regarding the pathogenesis. We discussed management options which include medical management, dietary modification, bladder instillations, and other treatments. For now she will start with    Bladder Diet  Physical Therapy  Urogesic Blue  Prelief  Bladder Ease  Marshmallow Root  Bladder Instillations  Hydrodistention of the bladder for both diagnostic evaluation and therapeutic effect.    Treatment for IC Flare    ??? May take OTC Bladder Ease, or Marsh Root (tea or tablets)  ??? Try drinking a ???shot??? of Apple cider vinegar, or Tsp of baking soda in 5oz water.  ??? Avoid certain foods that may flare up the bladder such as, Choc, spicy foods or herbs, tomato sauce, citrus foods or drinks, fruits- lemons, oranges, pineapples, kiwis. Beer, wine, coffee, tea.  ??? Bladder Instillations        Orders:  -     REFERRAL TO PHYSICAL THERAPY    Other orders  -     mth-me blue-sod phos-phsal-hyo (URIBEL) 118-10-40.8-36 mg cap capsule; Take 1 Cap by mouth four (4) times daily.      Follow-up and Dispositions    ?? Return in about 2 months (around 03/25/2018), or if symptoms worsen or fail to improve, for IC/HTPF.       We spent 60 minutes of face to face time today in the office and greater than >50% of this time was spent counseling the patient and coordinating care.        Edmund Hilda, DO

## 2018-01-23 NOTE — Assessment & Plan Note (Signed)
We discussed the differential diagnosis of urinary incontinence. We also discussed the pathogenesis and etiology of stress urinary incontinence. I explained the epidemiology of incontinence. I offered her options which include nothing, physical therapy, barrier treatment, and surgery    We are going to start with PT:  We discussed the purpose of physical therapy which is to strengthen the pelvic floor muscles and teach proper coordination of those muscles. I described the anatomy of those muscles involved and their relationship to the end-organs in the pelvis. I described therapy techniques which include a combination of therapeutic exercise, biofeedback, neuromuscular re-education, home programs, and electrical stimulation, as well as therapeutic massage and ultrasound for pain.

## 2018-01-31 ENCOUNTER — Inpatient Hospital Stay: Admit: 2018-01-31 | Payer: BLUE CROSS/BLUE SHIELD | Primary: Family Medicine

## 2018-01-31 ENCOUNTER — Other Ambulatory Visit: Payer: Self-pay

## 2018-01-31 ENCOUNTER — Encounter (HOSPITAL_COMMUNITY): Payer: Self-pay

## 2018-01-31 ENCOUNTER — Ambulatory Visit (HOSPITAL_COMMUNITY)
Admission: EM | Admit: 2018-01-31 | Discharge: 2018-01-31 | Disposition: A | Discharge status: Home / Self Care | Payer: Self-pay | Attending: Family Medicine | Admitting: Family Medicine

## 2018-01-31 DIAGNOSIS — F3289 Other specified depressive episodes: Secondary | ICD-10-CM

## 2018-01-31 DIAGNOSIS — N393 Stress incontinence (female) (male): Secondary | ICD-10-CM

## 2018-01-31 NOTE — Other (Addendum)
Marissa Perry  DOB: 1976/01/10  Primary: Marissa Perry Of [place] [facility]  Secondary:  [facility]  [address], [address], [place]  Phone:[phone]   [phone]        OUTPATIENT PHYSICAL THERAPY:Initial Assessment 01/31/2018   ICD-10: Treatment Diagnosis:     Stress incontinence (female) (female) (N39.3)  Muscle weakness (generalized) (M62.81)  Unspecified lack of coordination (R27.9)  Precautions/Allergies:   Codeine   TREATMENT PLAN:  Effective Dates: 01/31/2018 TO 05/01/2018 (90 days).  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 Pt demonstrates weakness and poor coordination of PFM contraction, as well as increase tone in PFMs. These S/S are indicative of high-tone PF dysfunction, SUI and IC and weakness of PFM.  Patient will benefit from manual interventions (soft tissue mobilizations, joint mobilizations, TDN, MET,etc.) as indicated, therapeutic exercises/activities as indicated, and biofeedback for neuromuscular re-education to improve patient's quality of life and functional mobility.     PROBLEM LIST (Impacting functional limitations):  1. Decreased Strength  2. Decreased ADL/Functional Activities  3. Decreased Transfer Abilities  4. Increased Pain  5. Decreased Activity Tolerance  6. Increased Fatigue  7. Decreased Flexibility/Joint Mobility  8. Decreased Independence with Home Exercise Program INTERVENTIONS PLANNED:  1. Balance Exercise  2. Bed Mobility  3. Cold  4. Electrical Stimulation  5. Family Education   6. Heat  7. Home Exercise Program (HEP)  8. Manual Therapy  9. Neuromuscular Re-education/Strengthening  10. Range of Motion (ROM)  11. Therapeutic Activites  12. Therapeutic Exercise/Strengthening  13. Transcutaneous Electrical Nerve Stimulation (TENS)  14. Transfer Training  15. Ultrasound ([place])   GOALS: (Goals have been discussed and agreed upon with patient.)  Short-Term Functional Goals: Time Frame: 6 weeks  1. Patient will be independent and compliant with HEP   2. Patient will tolerate manual interventions (joint mobilizations, STM, TDN, trigger point release, PROM, etc.) to improve joint ROM/soft tissue mobility of restricted structures to improve functional mobility.   3. Patient will report <2/10 pain with functional ADLs and the urge to urinate to improve quality of life  4. Patient will report <1 pad a day used secondary to incontinence to demonstrate decrease in incontinence  Discharge Goals: Time Frame: 12 weeks  1. Pt will be independent with comprehensive HEP  2. Pt will report <1/10 pain with functional ADLs for improved mobility and the urge to urinate.  3. Pt will be able to demonstrate >3/5 PFM contraction X>5 second hold 5x to demonstrate increase in PFM contraction     Outcome Measure:   Tool Used: Pelvic Floor Impact Questionnaire???short form 7 (PFIQ-7)   Score:  Initial:   ?? Bladder or Urine: 3  ?? Bowel or Rectum: 0  ?? Vagina or Pelvis: 0 Most Recent: X (Date: -- )  ?? Bladder or Urine: X  ?? Bowel or Rectum: X  ?? Vagina or Pelvis: X   Interpretation of Score: Each of the 7 sections is scored on a scale from 0-3; 0 representing "Not at all", 3 representing "Quite a bit". The mean value is  taken from all the answered items, then multiplied by 100 to obtain the scale score, ranging from 0-100.  This process is repeated for each column representing bowel, bladder, and pelvic pain.        Tool Used: Pelvic Floor Disability Index (PFDI-20)  Score:  Initial: 3/70 Most Recent: X (Date: -- )    Interpretation of Score:  This survey asks questions concerning certain  bowel, bladder, or pelvic symptoms and how much this symptoms interfere with daily activiites.  Each section is scored on a 0-4 scale, 4 representing the greatest disability.  The scores of each section are added together for a total score out of 70.        Medical Necessity:   ?? Patient is expected to demonstrate progress in strength, coordination and functional technique to decrease pain with urge to urinate and ADLs and decrease urinary stress incontinence to improve overall quality of life.  Reason for Services/Other Comments:  ?? Patient requries therapeutic interventions to improve her soft tissue mobility, strength, coordination to improve quality of life and increase ability to complete ADL without pain.  Total Duration: Evaluation 25 minutes, treatment 25 minutes       Rehabilitation Potential For Stated Goals: Good  Regarding Marissa Perry's therapy, I certify that the treatment plan above will be carried out by a therapist or under their direction.  Thank you for this referral,  Marissa Perry     Referring Physician Signature: [name], DO             Date                   PAIN/SUBJECTIVE:   Initial:   2/10 Post Session:  2/10     HISTORY:   History of Present Injury/Illness (Reason for Referral):  Marissa Perry states a few weeks ago she was jumping on the trampoline trying to exercise and felt something "fall" and noticed she started to urinate on herself. She says she then noticed she was peeing on herself more and more. She went to the ER and they said that she had a prolapsed bladder. When she went to the urogyn she did not see the prolapse at the time, and referred her to PT. She states she has been taking Urabel which she thinks has been helping. Pt also states decrease in sexual desire. Pt states since then driving >07 mins causes pelvic pain ant.     Urinary: Frequency 10 x/day, 3 x/night.                        -Positive for stress urinary incontinence (SUI),urgency, frequency, incomplete emptying dysuria.                      - 2 pads per day (PPD)                             Fluid intake: 4 glasses of water/day; bladder irritants include: coffee    Bowel: Frequency 1x/day.                      -Positive for pain with bowel movement (BM), pushing/straining with BM, incomplete emptying, fecal incontinence, constipation.                            -[place] stool type:  5/6                       -Use of stool softeners or laxatives? No    Sexual: Pt is sexually active.                     -Female partners.                     -Contraception/birth control: no.                     -Positive for dyspareunia with certain positions.    Pelvic Organ Prolapse/Pelvic Pain: Location: Anterior.                         -Worse with jumping/exercise.                         -Relieved with sitting, sex, urabel.                        -Max pain 5/10. Min pain 1-2/10.    Past Medical History/Comorbidities:   Ms. Eckert  has a past medical history of Abnormal uterine bleeding (AUB) (03/23/2014), Adenomyosis, Anxiety, Cyst of right ovary (11/16/2016), Enlarged uterus, History of DVT (deep vein thrombosis) (03/23/2014), Menorrhagia, Psychiatric disorder, Pulmonary embolism ([place]), Severe obesity ([place]) (01/01/2018), Thromboembolus ([place]) (9/14), and Unspecified adverse effect of anesthesia.  Ms. Coletti  has a past surgical history that includes hx cervical fusion (09/2012 at [facility]); hx endoscopy (12/2013); hx colonoscopy (12/2013); hx wisdom teeth extraction; hx cesarean section (1993); hx dilation and curettage; and hx total laparoscopic hysterectomy.    Social History/Living Environment:     Lives with husband  Have you ever had any pelvic trauma (orthopedic in nature, fall, MVA, etc.)? Hysterectomy, 4 child births ( 3 vaginal, 1 Csection, 1 miscarriage)  Have you ever experienced any unwanted physical or sexual contact? no   Have you ever experienced any form of medical trauma (GYN, urological, GI, etc)? - ER, hysterectomy, vaginal births and miscarriage    Prior Level of Function/Work/Activity:  Full time  Other Clinical Tests:          Bladder scan  [place] and gyno- loose fluid from cyst rupture     Ambulatory/Rehab Services H2 Model Falls Risk Assessment    Risk Factors:       No Risk Factors Identified Ability to Rise from Chair:       (0)  Ability to rise in a single movement    Falls Prevention Plan:       No modifications necessary   Total: (5 or greater = High Risk): 0    ??2010 [facility] [place]. [place] Adult nurse [phone]. Federal Law prohibits the replication, distribution or use without written permission from [facility] [employer]     Current Medications:       Current Outpatient Medications:   ???  mth-me blue-sod phos-phsal-hyo (URIBEL) 118-10-40.8-36 mg cap capsule, Take 1 Cap by mouth four (4) times daily., Disp: 120 Cap, Rfl: 12  ???  tolterodine ER (DETROL LA) 4 mg ER capsule, Take 1 Cap by mouth daily. Indications: overactive bladder, Disp: 30 Cap, Rfl: 2  ???  dabigatran etexilate (PRADAXA) 75 mg capsule, Take 75 mg by mouth every twelve (12) hours., Disp: , Rfl:   ???  escitalopram  oxalate (LEXAPRO) 10 mg tablet, Take 10 mg by mouth daily., Disp: , Rfl:    Date Last Reviewed:  01/30/2018   Number of Personal Factors/Comorbidities that affect the Plan of Care: 1-2: MODERATE COMPLEXITY   EXAMINATION:   OBSERVATION:   External Observation:   ?? Voluntary Contraction: poor   ?? Voluntary Relaxation: good  ?? Involuntary Contraction: n/t  ?? Involuntary Relaxation: n/t  ?? Perineal Body Assessment: WNL  ?? Anal Wink: n/t  ?? Skin Integrity: good  ?? Vaginal Vault Size: increased    PALPATION:  Superficial Pelvic Floor Musculature (PFM): Tender? Intermediate PFM Tender? Deep PFM Tender? External Palpation Tender?   R Superficial Transverse Perineal Y R Deep Transverse Perineal Y R.  Puborectalis Y Abdominals Y   L Superficial Transverse Perineal Y L Deep Transverse Perineal Y L. Puborectalis Y R/L hip adductors Y (B)   R Ischiocavernosus  R Compressor Urethra Y R. Pubococcygeus Y R/L Iliacus/psoas Y L   L Ischiocavernosus  L Compressor Urethra Y L. Pubococcygeus Y R/L Glutes     R Bulbocavernosus    R. Ileococcygeus Y R/L Piriformis    L Bulbocavernosus    L. Ileococcygeus Y         R. Obturator Internus          L. Obturator Internus          R. Coccygeus          L. Coccygeus        RANGE OF MOTION:  HIP- WFL    STRENGTH:  P: Power, E: Endurance, R: Repetitions, QF: Quick Flicks, TrA: Transverse Abdominus  P 2   E 3   R 3   QF 5   TrA Poor-painful L pelvis     COORDINATION:  Diaphragmatic breathing (DB): unable to maintain PFM contraction with breathing    SPECIAL TESTS:    Supine cough test: N/T  POP-Q: (Pelvic Organ Prolapse - Quantification Exam) per MD note: POP-Q: (Pelvic Organ Prolapse - Quantification Exam):  ??  -3 Aa -3 Ba -6 C   3 gh 3 pb 8 tvl   -3 Ap -3 Bp X D       SEMG evaluation (Date TBD): N/T  ??  Resting Tone:   ??  Quality of Resting Tone: Not tested  ??  5 Second Hold: - uV  ??  10 Second Hold: - uV  ??  Quality of Recruitment: n/t   ??  Quality of Relaxation: n/t   ??  Quality of Holding: n/t   ??  Stability of Hold: n/t   ??  Stability of Rest: n/t      Body Structures Involved:  1. Digestive Structures  2. Joints  3. Muscles  4. Ligaments Body Functions Affected:  1. Sensory/Pain  2. Genitourinary  3. Neuromusculoskeletal  4. Movement Related  5. Digestive Activities and Participation Affected:  1. Learning and Applying Knowledge  2. Communication  3. Mobility  4. Self Care  5. Interpersonal Interactions and Relationships  6. Community, Social and [employer]   Number of elements (examined above) that affect the Plan of Care: 1-2: LOW COMPLEXITY   CLINICAL PRESENTATION:   Presentation: Stable and uncomplicated: LOW COMPLEXITY   CLINICAL DECISION MAKING:    Use of outcome tool(s) and clinical judgement create a POC that gives a: Clear prediction of patient's progress: LOW COMPLEXITY        Richarda Blade PT, DPT

## 2018-01-31 NOTE — Progress Notes (Signed)
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  Required moderate visual, verbal, manual and tactile cues to promote proper body alignment, promote proper body posture and promote proper body breathing techniques.  Progressed resistance, range, repetitions and complexity of movement as indicated.    Exercises:  Patient instructed in pelvic floor exercises listed below:   Date:  01/31/18 Date:   Date:     Activity/Exercise Parameters Parameters Parameters   education HEP, POC, pathology, PT goals     Butterfly stretch 2 mins     kegels with deep breathing 5x     Legs up wall  1 min edu                           MANUAL THERAPY: (0 minutes): Joint mobilization and Soft tissue mobilization was utilized and necessary because of the patient's restricted joint motion, painful spasm and restricted motion of soft tissue.   (Used abbreviations: MET - muscle energy technique; SCS- Strain counter strain; CTM- Connective tissue mobilizations; CR- Contract/relax; SP- Sustained pressure, TrP- Trigger point release, IASTM- Instrument assisted soft tissue mobilizations, TDN- Trigger point dry needling).             Pt gives verbal and written consent to internal vaginal  assessment/treatment no chaperon present.    [facility] Portal  The following educational topics were reviewed with patient:  HEP, bladder irritants, bowel edu.  Treatment/Session Summary:    ?? Response to Treatment:  Pt demonstrated understanding of POC and PT goals. Pt instructed in initial HEP .  ?? Communication/Consultation:  POC, PT goals, HEP   ?? Equipment provided today:  HEP handout   ?? Recommendations/Intent for next treatment session: Next visit will focus on Manual interventions as indicated, progression of exercises/ROM as indicated .    Total Treatment Billable Duration:  50 minutes  PT Patient Time In/Time Out  Time In: 0830  Time Out: [place]    Future Appointments   Date Time Provider [place]   02/04/2018  8:00 AM [name] [facility]   02/11/2018  8:00 AM [name]   02/25/2018  8:00 AM [name]   03/04/2018  8:00 AM [name]   03/11/2018  8:00 AM [name]   03/24/2018  4:00 PM Rinko, Wells Guiles, DO [place]

## 2018-01-31 NOTE — Progress Notes (Signed)
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  Required moderate visual, verbal, manual and tactile cues to promote proper body alignment, promote proper body posture and promote proper body breathing techniques.  Progressed resistance, range, repetitions and complexity of movement as indicated.    Exercises:  Patient instructed in pelvic floor exercises listed below:   Date:  01/31/18 Date:   Date:     Activity/Exercise Parameters Parameters Parameters   education HEP, POC, pathology, PT goals     Butterfly stretch 2 mins     kegels with deep breathing 5x     Legs up wall  1 min edu                           MANUAL THERAPY: (0 minutes): Joint mobilization and Soft tissue mobilization was utilized and necessary because of the patient's restricted joint motion, painful spasm and restricted motion of soft tissue.   (Used abbreviations: MET - muscle energy technique; SCS- Strain counter strain; CTM- Connective tissue mobilizations; CR- Contract/relax; SP- Sustained pressure, TrP- Trigger point release, IASTM- Instrument assisted soft tissue mobilizations, TDN- Trigger point dry needling).             Pt gives verbal and written consent to internal vaginal assessment/treatment  no chaperon present.    [facility] Portal  The following educational topics were reviewed with patient:  HEP, bladder irritants, bowel edu.  Treatment/Session Summary:     Response to Treatment:  Pt demonstrated understanding of POC and PT goals. Pt instructed in initial HEP .   Communication/Consultation:  POC, PT goals, HEP    Equipment provided today:  HEP handout    Recommendations/Intent for next treatment session: Next visit will focus on Manual interventions as indicated, progression of exercises/ROM as indicated .    Total Treatment Billable Duration:  50 minutes  PT Patient Time In/Time Out  Time In: 0830  Time Out: 0930  [place]    Future Appointments   Date Time Provider Department Center   02/04/2018  8:00 AM Carie Caddy [facility]   02/11/2018  8:00 AM Carie Caddy [facility]   02/25/2018  8:00 AM Carie Caddy [facility]   03/04/2018  8:00 AM Carie Caddy [facility]   03/11/2018  8:00 AM Carie Caddy [facility]   03/24/2018  4:00 PM Rinko, Lurena Joiner, DO [facility]

## 2018-01-31 NOTE — Other (Addendum)
Therapy  Evaluation by [name] at 01/31/18 5009                Author: [name]  Service: --  Author Type: Physical Therapist       Filed: 01/31/18 1305  Date of Service: 01/31/18 0902  Status: Addendum          Editor: [name] (Physical Therapist)          Related Notes: Original Note by [name] (Physical Therapist) filed at 01/31/18 [place]   DOB: 07-17-1976   Primary: Marissa Perry Caroli*   Secondary:   [facility]   [address], [place]   Phone:[phone]   [phone]                    OUTPATIENT PHYSICAL THERAPY:Initial Assessment 01/31/2018      ICD-10: Treatment Diagnosis:       Stress incontinence (female) (female) (N39.3)   Muscle weakness (generalized) (M62.81)   Unspecified lack of coordination (R27.9)   Precautions/Allergies:    Codeine    TREATMENT PLAN:   Effective Dates: 01/31/2018 TO 05/01/2018 (90 days).  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 Maultsby  presents to physical therapy with c/o anterior pelvic pain with urge to urinate and stress incontinence. Pt demonstrates weakness and poor coordination of PFM contraction, as well as increase tone in PFMs. These S/S are indicative of high-tone PF dysfunction,  SUI and IC and weakness of PFM.  Patient will benefit from manual interventions (soft tissue mobilizations, joint mobilizations, TDN, MET,etc.) as indicated, therapeutic exercises/activities as indicated, and biofeedback for neuromuscular re-education  to improve patient's quality of life and functional mobility.           PROBLEM LIST (Impacting functional limitations):   1.  Decreased Strength   2.  Decreased ADL/Functional Activities   3.  Decreased Transfer Abilities   4.  Increased Pain   5.  Decreased Activity Tolerance   6.  Increased Fatigue   7.  Decreased Flexibility/Joint Mobility   8.  Decreased Independence with Home Exercise Program  INTERVENTIONS PLANNED:   1.  Balance Exercise   2.  Bed Mobility   3.  Cold   4.  Electrical Stimulation   5.  Family Education   6.  Heat   7.  Home Exercise Program (HEP)   8.  Manual Therapy   9.  Neuromuscular Re-education/Strengthening   10.  Range of Motion (ROM)   11.  Therapeutic Activites   12.  Therapeutic Exercise/Strengthening   13.  Transcutaneous Electrical Nerve Stimulation (TENS)   14.  Transfer Training   15.  Ultrasound ([place])          GOALS: (Goals have been discussed and agreed upon with patient.)   Short-Term Functional Goals: Time Frame: 6 weeks   1.  Patient will be independent and compliant with HEP  2.  Patient will tolerate manual interventions (joint mobilizations, STM, TDN, trigger point release, PROM, etc.) to improve joint ROM/soft tissue mobility of restricted structures to improve functional mobility.    3.  Patient will report <2/10 pain with functional ADLs and the urge to urinate to improve quality of life   4.  Patient will report <1 pad a day used secondary to incontinence to demonstrate decrease in incontinence   Discharge Goals: Time Frame: 12 weeks   1.  Pt will be independent with comprehensive HEP   2.  Pt will report <1/10 pain with functional ADLs for improved mobility and the urge to urinate.   3.  Pt will be able to demonstrate >3/5 PFM contraction X>5 second hold 5x to demonstrate increase in PFM contraction       Outcome Measure:    Tool Used: Pelvic Floor Impact Questionnaire--short form 7 (PFIQ-7)    Score:   Initial:      Bladder or Urine: 3     Bowel or Rectum: 0     Vagina or Pelvis: 0  Most Recent: X (Date: -- )     Bladder or  Urine: X     Bowel or Rectum: X     Vagina or Pelvis: X        Interpretation of Score:  Each of the 7 sections is scored on a scale from 0-3; 0 representing "Not at all", 3 representing "Quite a bit". The mean value is taken from all the answered items, then multiplied by 100 to obtain the scale score, ranging from 0-100.  This process is  repeated for each column representing bowel, bladder, and pelvic pain.            Tool Used: Pelvic Floor Disability Index (PFDI-20)   Score:   Initial: 3/70  Most Recent: X (Date: -- )        Interpretation of Score:   This survey asks questions concerning certain   bowel, bladder, or pelvic symptoms and how much this symptoms interfere with daily activiites.   Each section is scored on a 0-4 scale, 4  representing the greatest disability.  The scores of each section are added together for a total score out of  70.           Medical Necessity:      Patient is expected to demonstrate progress in strength, coordination and functional technique to decrease pain with urge to  urinate and ADLs and decrease urinary stress incontinence to improve overall quality of life.   Reason for Services/Other Comments:     Patient requries therapeutic interventions to improve her soft tissue mobility, strength, coordination to improve quality of  life and increase ability to complete ADL without pain.   Total Duration: Evaluation 25 minutes, treatment 25 minutes          Rehabilitation Potential For Stated Goals: Good   Regarding Marissa Perry's therapy, I certify that the treatment plan above will be carried out by a therapist or under their direction.   Thank you for this referral,   [name]      Referring Physician Signature: [name], DO              Date                                PAIN/SUBJECTIVE:      Initial:  2/10  Post Session:  2/10           HISTORY:      History of Present Injury/Illness (Reason for Referral):   Marissa Perry states a few weeks ago she was  jumping on the trampoline trying to exercise  and felt something "fall" and noticed she started to urinate on herself. She says she then noticed she was peeing on herself more and more. She went to the ER and they said that she had a prolapsed bladder. When she went to the urogyn she did not see  the prolapse at the time, and referred her to PT. She states she has been taking Urabel which she thinks has been helping. Pt also states decrease in sexual desire. Pt states since then driving >62 mins causes pelvic pain ant.       Urinary: Frequency 10 x/day, 3 x/night.                        -Positive for stress urinary incontinence (SUI),urgency, frequency, incomplete emptying dysuria.                       - 2 pads per day (PPD)                                Fluid intake: 4 glasses of water/day; bladder irritants include: coffee      Bowel: Frequency 1x/day.                       -Positive for pain with bowel movement (BM), pushing/straining with BM, incomplete emptying, fecal incontinence, constipation.                              -[place] stool type: 5/6                        -Use of stool softeners or laxatives? No      Sexual: Pt is sexually active.                      -Female partners.                      -Contraception/birth control: no.                      -Positive for dyspareunia with certain positions.      Pelvic Organ Prolapse/Pelvic Pain: Location: Anterior.                          -Worse with jumping/exercise.                          -Relieved with sitting, sex, urabel.                         -Max pain 5/10.  Min pain 1-2/10.      Past Medical History/Comorbidities:    Ms. Sandra   has a past medical history of Abnormal uterine bleeding (AUB) (03/23/2014), Adenomyosis, Anxiety, Cyst of right ovary (11/16/2016), Enlarged uterus, History of DVT (deep vein thrombosis) (03/23/2014), Menorrhagia, Psychiatric disorder, Pulmonary embolism  ([place]), Severe obesity ([place]) (01/01/2018),  Thromboembolus ([place]) (9/14),  and Unspecified adverse effect of anesthesia.  Ms.  Blakley  has a past surgical history that includes hx cervical fusion (09/2012 at [facility]);  hx endoscopy (12/2013); hx colonoscopy (12/2013); hx wisdom teeth extraction; hx cesarean section (1993); hx dilation and curettage; and hx total laparoscopic hysterectomy.      Social History/Living Environment:      Lives with husband   Have you ever had any pelvic trauma (orthopedic in nature, fall, MVA, etc.)? Hysterectomy, 4 child births ( 3 vaginal, 1 Csection, 1 miscarriage)   Have you ever experienced any unwanted physical or sexual contact? no   Have you ever experienced any form of medical trauma (GYN, urological, GI, etc)? - ER, hysterectomy, vaginal births and miscarriage      Prior Level of Function/Work/Activity:   Full time   Other Clinical Tests:           Bladder scan   [place] and gyno- loose fluid from cyst rupture        Ambulatory/Rehab Services H2 Model Falls Risk Assessment        Risk Factors:        No Risk Factors Identified  Ability to Rise from Chair:        (0)  Ability to rise in a single movement        Falls Prevention Plan:        No modifications necessary      Total: (5 or greater = High Risk):  0        2010 [facility]. [place] Adult nurse [phone]. Federal Law prohibits the replication, distribution or use without  written permission from [facility] [employer]         Current Medications:          Current Outpatient Medications:    ?  mth-me blue-sod phos-phsal-hyo (URIBEL) 118-10-40.8-36 mg cap capsule, Take 1 Cap by mouth four (4) times daily., Disp: 120 Cap, Rfl: 12   ?  tolterodine ER (DETROL LA) 4 mg ER capsule, Take 1 Cap by mouth daily. Indications: overactive bladder, Disp: 30 Cap, Rfl: 2   ?  dabigatran etexilate (PRADAXA) 75 mg capsule, Take 75 mg by mouth every twelve (12) hours., Disp: , Rfl:    ?  escitalopram oxalate (LEXAPRO) 10 mg tablet, Take 10 mg by mouth daily., Disp: , Rfl:       Date Last  Reviewed:  01/30/2018      Number of Personal Factors/Comorbidities that affect the Plan of Care:  1-2: MODERATE COMPLEXITY       EXAMINATION:      OBSERVATION:    External Observation:      Voluntary Contraction: poor      Voluntary Relaxation: good     Involuntary Contraction: n/t     Involuntary Relaxation: n/t     Perineal Body Assessment: WNL     Anal Wink: n/t     Skin Integrity: good     Vaginal Vault Size: increased      PALPATION:   Superficial Pelvic Floor Musculature (PFM):  Tender?  Intermediate PFM  Tender?  Deep PFM  Tender?  External Palpation  Tender?      R Superficial Transverse Perineal  Y  R Deep Transverse Perineal  Y  R. Puborectalis  Y  Abdominals  Y      L Superficial Transverse Perineal  Y  L Deep Transverse Perineal  Y  L. Puborectalis  Y  R/L hip adductors  Y (B)      R Ischiocavernosus    R Compressor Urethra  Y  R. Pubococcygeus  Y  R/L Iliacus/psoas  Y L      L Ischiocavernosus    L Compressor Urethra  Y  L. Pubococcygeus  Y  R/L Glutes         R Bulbocavernosus        R. Ileococcygeus  Y  R/L Piriformis        L Bulbocavernosus        L. Ileococcygeus  Y                  R. Obturator Internus                    L. Obturator Internus                    R. Coccygeus                    L. Coccygeus               RANGE OF MOTION:   HIP- WFL      STRENGTH:   P: Power, E: Endurance, R: Repetitions, QF: Quick Flicks, TrA: Transverse Abdominus   P  2      E  3      R  3      QF  5      TrA  Poor-painful L pelvis         COORDINATION:   Diaphragmatic breathing (DB): unable to maintain PFM contraction with breathing      SPECIAL TESTS:      Supine cough test: N/T   POP-Q: (Pelvic Organ Prolapse - Quantification Exam) per MD note:  POP-Q: (Pelvic Organ Prolapse - Quantification Exam):      -3  Aa  -3  Ba  -6  C      3  gh  3  pb  8  tvl      -3  Ap  -3  Bp  X  D            SEMG evaluation (Date TBD): N/T      Resting Tone:       Quality of Resting Tone: Not tested      5 Second Hold: -  uV      10 Second Hold: - uV      Quality of Recruitment: n/t       Quality of Relaxation: n/t       Quality of Holding: n/t       Stability of Hold: n/t       Stability of Rest: n/t          Body Structures Involved:   1.  Digestive Structures   2.  Joints   3.  Muscles   4.  Ligaments  Body Functions Affected:   1.  Sensory/Pain   2.  Genitourinary   3.  Neuromusculoskeletal   4.  Movement Related   5.  Digestive  Activities and Participation Affected:   1.  Learning and Applying Knowledge   2.  Communication   3.  Mobility   4.  Self Care   5.  Interpersonal Interactions and Relationships   6.  Community, Social and [employer]      Number of elements (examined above) that affect the Plan of Care:  1-2: LOW COMPLEXITY       CLINICAL PRESENTATION:      Presentation:  Stable and uncomplicated: LOW COMPLEXITY       CLINICAL DECISION MAKING:      Use of outcome tool(s) and clinical judgement create a POC that gives a:  Clear prediction of patient's progress: LOW COMPLEXITY                Richarda Blade PT, DPT

## 2018-01-31 NOTE — Discharge Instructions (Addendum)
Blood work ordered.  We will follow up with you regarding your results PCP assistance initiated See handout for additional suggestions of what you can do at home Return or go to the ER if you experience new or worsening symptoms

## 2018-01-31 NOTE — ED Triage Notes (Signed)
Pt presents with complaints of depression, hair falling out, fatigue and headaches. Pt denies SI. She was told her thyroid levels showed hypothyroidism.

## 2018-01-31 NOTE — ED Notes (Signed)
Pt stated she was going to get her daughter at [id number], pt never returned, not seen outside or in the lobby.

## 2018-01-31 NOTE — ED Provider Notes (Signed)
[facility]   [id number] 01/31/18 Arrival Time: 1230  SUBJECTIVE:  Ann Haney is a 42 y.o. female who has complains of depression for the past year.  Denies a precipitating event, but symptoms worsened after [place].   Patient also had blood work done and was told her TSH was low, and thyroid hormone was within normal limits.  Complains of associated hair falling out, fatigue, nausea, and HAs.  Denies SI or HI. Patient has not sought out counseling or treatment for this problem due to not having insurance. Denies aggravating symptoms  Denies similar symptoms in the past.  Complains of chills, and weight-gain (15lbs in 2 weeks).    Denies fever, vomiting, chest pain, SOB, abdominal pain, changes in bowel or bladder activities.     ROS: As per HPI.  Past Medical History:  Diagnosis Date  . Arthritis    Left Knee  . Renal disorder    kidney stone   Past Surgical History:  Procedure Laterality Date  . TUBAL LIGATION     Allergies  Allergen Reactions  . Codeine Nausea And Vomiting   No current facility-administered medications on file prior to encounter.    Current Outpatient Medications on File Prior to Encounter  Medication Sig Dispense Refill  . cetirizine (ZYRTEC) 10 MG tablet Take 10 mg by mouth daily.    Marland Kitchen. ibuprofen (ADVIL,MOTRIN) 800 MG tablet Take 1 tablet (800 mg total) by mouth 3 (three) times daily. (Patient taking differently: Take 800 mg by mouth 3 (three) times daily as needed for moderate pain. ) 21 tablet 0  . Aspirin-Salicylamide-Caffeine (ARTHRITIS STRENGTH BC POWDER PO) Take 1 packet by mouth every 6 (six) hours as needed (For knee pain.).     Marland Kitchen. Multiple Vitamin (MULTIVITAMIN WITH MINERALS) TABS Take 1 tablet by mouth every morning.     Marland Kitchen. oxyCODONE-acetaminophen (PERCOCET) 5-325 MG per tablet Take 1-2 tablets by mouth every 4 (four) hours as needed for pain. (Patient not taking: Reported on 01/17/2016) 30 tablet 0  .  promethazine (PHENERGAN) 25 MG tablet Take 1 tablet (25 mg total) by mouth every 6 (six) hours as needed for nausea. (Patient not taking: Reported on 01/17/2016) 15 tablet 0  . tamsulosin (FLOMAX) 0.4 MG CAPS capsule Take 1 capsule (0.4 mg total) by mouth daily. (Patient not taking: Reported on 01/17/2016) 10 capsule 0   Social History   Socioeconomic History  . Marital status: Single    Spouse name: Not on file  . Number of children: Not on file  . Years of education: Not on file  . Highest education level: Not on file  Occupational History  . Not on file  Social Needs  . Financial resource strain: Not on file  . Food insecurity:    Worry: Not on file    Inability: Not on file  . Transportation needs:    Medical: Not on file    Non-medical: Not on file  Tobacco Use  . Smoking status: Never Smoker  . Smokeless tobacco: Never Used  Substance and Sexual Activity  . Alcohol use: Yes    Comment: occasionally  . Drug use: No  . Sexual activity: Not on file  Lifestyle  . Physical activity:    Days per week: Not on file    Minutes per session: Not on file  . Stress: Not on file  Relationships  . Social connections:    Talks on phone: Not on file    Gets together:  Not on file    Attends religious service: Not on file    Active member of club or organization: Not on file    Attends meetings of clubs or organizations: Not on file    Relationship status: Not on file  . Intimate partner violence:    Fear of current or ex partner: Not on file    Emotionally abused: Not on file    Physically abused: Not on file    Forced sexual activity: Not on file  Other Topics Concern  . Not on file  Social History Narrative  . Not on file   No family history on file.  OBJECTIVE:  Vitals:   01/31/18 1300  BP: (!) 149/82  Pulse: 65  Resp: 18  Temp: 98.4 F (36.9 C)  SpO2: 100%    General appearance: alert; no distress Eyes: PERRLA; EOMI HENT: normocephalic; atraumatic Neck:  supple with FROM Lungs: clear to auscultation bilaterally Heart: regular rate and rhythm.  Radial pulses 2+ symmetrical bilaterally Abdomen: normal active bowel sounds; nontender to light or deep palpation Extremities: no edema; symmetrical with no gross deformities Skin: warm and dry Psychological: alert and cooperative; flat affect  PHQ-2 questionnaire: During the last month, have you often been bothered by feeling down, depressed, or hopeless? No During the last month, have you often been bothered by having little interest or pleasure in doing things? Unsure   ASSESSMENT & PLAN:  1. Other depression     No orders of the defined types were placed in this encounter.  Blood work ordered.  We will follow up with you regarding your results PCP assistance initiated See handout for additional suggestions of what you can do at home Return or go to the ER if you experience new or worsening symptoms  Patient left prior to getting blood work drawn.    Reviewed expectations re: course of current medical issues. Questions answered. Outlined signs and symptoms indicating need for more acute intervention. Patient verbalized understanding. After Visit Summary given.   Rennis Harding, PA-C 01/31/18 1549

## 2018-02-04 ENCOUNTER — Inpatient Hospital Stay: Admit: 2018-02-04 | Payer: BLUE CROSS/BLUE SHIELD | Primary: Family Medicine

## 2018-02-04 DIAGNOSIS — N393 Stress incontinence (female) (male): Secondary | ICD-10-CM

## 2018-02-04 NOTE — Progress Notes (Signed)
OUTPATIENT PHYSICAL THERAPY: Daily Treatment Note 02/04/2018  Visit Count:  2    Return MD apt=03/22/18  Treatment Diagnosis:    Stress incontinence (female) (N39.3)  Muscle weakness (generalized) (M62.81)  Unspecified lack of coordination (R27.9    MEDICAL/REFERRING DIAGNOSIS:  High-tone pelvic floor dysfunction   REFERRING PHYSICIAN: [name], DO  Pre-treatment Symptoms/Complaints:  Increase in leakage over the weekend with laughing primarily. Had to change my underwear at least 3x/day. No pain with sex over the weekend or noticed any bladder pain. Difficult doing kegels when I tried to do them driving.   Pain: Initial:   2/10 Post Session:  0/10   Medications Last Reviewed:  01/31/18  Updated Objective Findings:  See evaluation note from today  TTP (B) adductors  2/5 PERF  TTP L OI   TTP abdominals    TREATMENT:   THERAPEUTIC EXERCISE: (20 minutes):  Exercises and SEMG per grid below to improve mobility, strength and coordination.  Required moderate visual, verbal, manual and tactile cues to promote proper body alignment, promote proper body posture and promote proper body breathing techniques.  Progressed resistance, range, repetitions and complexity of movement as indicated.    Exercises:  Patient instructed in pelvic floor exercises listed below:   Date:  01/31/18 Date:  02/04/18 Date:     Activity/Exercise Parameters Parameters Parameters   education HEP, POC, pathology, PT goals     Butterfly stretch 2 mins     kegels with deep breathing 5x     Legs up wall  1 min edu     Childs pose with PF drops  2 min    Happy baby with PF drops  1 min    IR OI stretch  1 min      SEMG evaluation (Date TBD): N/T  ??     Resting Tone: 10 uV  ??     Quality of Resting Tone: increase tone, 10-7 uV  ??     5 Second Hold: - uV  ??     10 Second Hold: - uV  ??     Quality of Recruitment: good  ??    Quality of Relaxation: poor, hesitation   ??    Quality of Holding: 2-3 seconds and hesitation    ??    Stability of Hold: unstable, hesitant   ??    Stability of Rest: hesitant     MANUAL THERAPY: (30 minutes): Joint mobilization and Soft tissue mobilization was utilized and necessary because of the patient's restricted joint motion, painful spasm and restricted motion of soft tissue.   (Used abbreviations: MET - muscle energy technique; SCS- Strain counter strain; CTM- Connective tissue mobilizations; CR- Contract/relax; SP- Sustained pressure, TrP- Trigger point release, IASTM- Instrument assisted soft tissue mobilizations, TDN- Trigger point dry needling).     -Contract-relax release of OI (L)  -adductor skin rolling/strumming  -(B) log rolling  -abdominal massage, gentle strumming  -Levator ani release          Pt gives verbal and written consent to internal vaginal assessment/treatment no chaperon present.    [employer] Portal  The following educational topics were reviewed with patient:  HEP, PF drops, yoga.  Treatment/Session Summary:    ?? Response to Treatment:  Posterior L hip pain reported with palpation of L OI, reduced with manual interventions. Minor pressure of L superficial transverse perineum. (B) adductor trigger points and Tenderness as well as abdominal tenderness. Increase PFM tone with SEMG today and hesitancy demonstrated with attempt  to relax  ?? Communication/Consultation:  Yoga for PF, PFM drops  ?? Equipment provided today:  HEP handout   ?? Recommendations/Intent for next treatment session: Next visit will focus on Manual interventions as indicated, progression of exercises/ROM as indicated .    Total Treatment Billable Duration:  50 minutes  PT Patient Time In/Time Out  Time In: 0805  Time Out: 0900  [name]    Future Appointments   Date Time Provider [place]   02/11/2018  8:00 AM [name] [facility]   02/25/2018  8:00 AM [name] [facility]   03/04/2018  8:00 AM [name] [facility]    03/11/2018  8:00 AM [name] [facility]   03/24/2018  4:00 PM Rinko, Wells Guiles, DO [facility]

## 2018-02-04 NOTE — Progress Notes (Signed)
 OUTPATIENT PHYSICAL THERAPY: Daily Treatment Note 02/04/2018  Visit Count:  2    Return MD apt=03/22/18  Treatment Diagnosis:    Stress incontinence (female) (N39.3)  Muscle weakness (generalized) (M62.81)  Unspecified lack of coordination (R27.9    MEDICAL/REFERRING DIAGNOSIS:  High-tone pelvic floor dysfunction   REFERRING PHYSICIAN: Lenette Stabs, DO  Pre-treatment Symptoms/Complaints:  Increase in leakage over the weekend with laughing primarily. Had to change my underwear at least 3x/day. No pain with sex over the weekend or noticed any bladder pain. Difficult doing kegels when I tried to do them driving.   Pain: Initial:   2/10 Post Session:  0/10   Medications Last Reviewed:  01/31/18  Updated Objective Findings:  See evaluation note from today  TTP (B) adductors  2/5 PERF  TTP L OI   TTP abdominals    TREATMENT:   THERAPEUTIC EXERCISE: (20 minutes):  Exercises and SEMG per grid below to improve mobility, strength and coordination.  Required moderate visual, verbal, manual and tactile cues to promote proper body alignment, promote proper body posture and promote proper body breathing techniques.  Progressed resistance, range, repetitions and complexity of movement as indicated.    Exercises:  Patient instructed in pelvic floor exercises listed below:   Date:  01/31/18 Date:  02/04/18 Date:     Activity/Exercise Parameters Parameters Parameters   education HEP, POC, pathology, PT goals     Butterfly stretch 2 mins     kegels with deep breathing 5x     Legs up wall  1 min edu     Childs pose with PF drops  2 min    Happy baby with PF drops  1 min    IR OI stretch  1 min      SEMG evaluation (Date TBD): N/T       Resting Tone: 10 uV       Quality of Resting Tone: increase tone, 10-7 uV       5 Second Hold: - uV       10 Second Hold: - uV       Quality of Recruitment: good      Quality of Relaxation: poor, hesitation       Quality of Holding: 2-3 seconds and hesitation       Stability of Hold: unstable,  hesitant       Stability of Rest: hesitant     MANUAL THERAPY: (30 minutes): Joint mobilization and Soft tissue mobilization was utilized and necessary because of the patient's restricted joint motion, painful spasm and restricted motion of soft tissue.   (Used abbreviations: MET - muscle energy technique; SCS- Strain counter strain; CTM- Connective tissue mobilizations; CR- Contract/relax; SP- Sustained pressure, TrP- Trigger point release, IASTM- Instrument assisted soft tissue mobilizations, TDN- Trigger point dry needling).     -Contract-relax release of OI (L)  -adductor skin rolling/strumming  -(B) log rolling  -abdominal massage, gentle strumming  -Levator ani release          Pt gives verbal and written consent to internal vaginal assessment/treatment no chaperon present.    [employer] Portal  The following educational topics were reviewed with patient:  HEP, PF drops, yoga.  Treatment/Session Summary:     Response to Treatment:  Posterior L hip pain reported with palpation of L OI, reduced with manual interventions. Minor pressure of L superficial transverse perineum. (B) adductor trigger points and Tenderness as well as abdominal tenderness. Increase PFM tone with SEMG today and hesitancy demonstrated with attempt  to relax   Communication/Consultation:  Yoga for PF, PFM drops   Equipment provided today:  HEP handout    Recommendations/Intent for next treatment session: Next visit will focus on Manual interventions as indicated, progression of exercises/ROM as indicated .    Total Treatment Billable Duration:  50 minutes  PT Patient Time In/Time Out  Time In: 0805  Time Out: 0900  Kylie N Watts    Future Appointments   Date Time Provider Department Center   02/11/2018  8:00 AM Adele Gutter N [facility]   02/25/2018  8:00 AM Adele Gutter [facility]   03/04/2018  8:00 AM Adele Gutter [facility]   03/11/2018  8:00 AM Adele Gutter [facility]   03/24/2018  4:00 PM Rinko,  [place], DO [facility]

## 2018-02-11 ENCOUNTER — Inpatient Hospital Stay: Admit: 2018-02-11 | Payer: BLUE CROSS/BLUE SHIELD | Primary: Family Medicine

## 2018-02-11 NOTE — Progress Notes (Signed)
OUTPATIENT PHYSICAL THERAPY: Daily Treatment Note 02/11/2018  Visit Count:  3    Return MD apt=03/22/18  Treatment Diagnosis:    Stress incontinence (female) (N39.3)  Muscle weakness (generalized) (M62.81)  Unspecified lack of coordination (R27.9    MEDICAL/REFERRING DIAGNOSIS:  High-tone pelvic floor dysfunction   REFERRING PHYSICIAN: [name], DO  Pre-treatment Symptoms/Complaints:  Much better. Have not really needed the Urabel and think a change in diet has helped. Minimal leakage over the last week and no pain noted  Pain: Initial:   0/10 Post Session:  0/10   Medications Last Reviewed:  01/31/18  Updated Objective Findings:  See evaluation note from today  TTP (B) adductors  3/5 PERF  TTP L OI and ischiocavernosus  TTP abdominals    TREATMENT:   THERAPEUTIC EXERCISE: (10 minutes):  Exercises and SEMG per grid below to improve mobility, strength and coordination.  Required moderate visual, verbal, manual and tactile cues to promote proper body alignment, promote proper body posture and promote proper body breathing techniques.  Progressed resistance, range, repetitions and complexity of movement as indicated.    Exercises:  Patient instructed in pelvic floor exercises listed below:   Date:  01/31/18 Date:  02/04/18 Date:  02/11/18   Activity/Exercise Parameters Parameters Parameters   education HEP, POC, pathology, PT goals     Butterfly stretch 2 mins     kegels with deep breathing 5x     Legs up wall  1 min edu     Childs pose with PF drops  2 min    Happy baby with PF drops  1 min    IR OI stretch  1 min    Clamshells  4 mins prior manual and 4 mins post with PF relaxation 4 mins prior manual and 4 mins post with PF relaxation                 SEMG evaluation (Date TBD): N/T  ??     Resting Tone: 10 uV prior to manual and 1.5-2.0 uV post manual  ??     Quality of Resting Tone: increase tone, 10-7 uV -> hesitancy at 1.5-2.0 uV post manual- improved resting tone in sidelying position  ??     5 Second Hold: - uV   ??     10 Second Hold: - uV  ??     Quality of Recruitment: good  ??    Quality of Relaxation: poor, hesitation->slightly improved post manual   ??    Quality of Holding: 2-3 seconds and hesitation   ??    Stability of Hold: unstable, hesitant   ??    Stability of Rest: hesitant     MANUAL THERAPY: (30 minutes): Joint mobilization and Soft tissue mobilization was utilized and necessary because of the patient's restricted joint motion, painful spasm and restricted motion of soft tissue.   (Used abbreviations: MET - muscle energy technique; SCS- Strain counter strain; CTM- Connective tissue mobilizations; CR- Contract/relax; SP- Sustained pressure, TrP- Trigger point release, IASTM- Instrument assisted soft tissue mobilizations, TDN- Trigger point dry needling).     -Contract-relax release of OI (B) and strumming of L ischiocavernosus   -adductor skin rolling/strumming (NOT TODAY)  -(B) log rolling (NOT TODAY)  -abdominal massage, gentle strumming  -Levator ani release          Pt gives verbal and written consent to internal vaginal assessment/treatment no chaperon present.    [employer] Portal  The following educational topics were reviewed with patient:  CLAMSHELL WITH PF DROPS/RELAXATION.  Treatment/Session Summary:    ?? Response to Treatment:  Decrease in PFM tone post manual interventions performed above, with most relax state being in sidelying position. Pt requires PF contraction with glut activation, but able to relax during clamshell contraction.   ?? Communication/Consultation:  PF relaxation with clamshell  ?? Equipment provided today:  HEP handout   ?? Recommendations/Intent for next treatment session: Next visit will focus on Manual interventions as indicated, progression of exercises/ROM as indicated .    Total Treatment Billable Duration:  40 minutes  PT Patient Time In/Time Out  Time In: 0800  Time Out: [place]    Future Appointments   Date Time Provider [place]    02/25/2018  8:00 AM [name] [facility]   03/04/2018  8:00 AM [name]   03/11/2018  8:00 AM [name]   03/24/2018  4:00 PM Rinko, Wells Guiles, DO [facility]

## 2018-02-11 NOTE — Progress Notes (Signed)
OUTPATIENT PHYSICAL THERAPY: Daily Treatment Note 02/11/2018  Visit Count:  3    Return MD apt=03/22/18  Treatment Diagnosis:    Stress incontinence (female) (N39.3)  Muscle weakness (generalized) (M62.81)  Unspecified lack of coordination (R27.9    MEDICAL/REFERRING DIAGNOSIS:  High-tone pelvic floor dysfunction   REFERRING PHYSICIAN: Edmund Hilda, DO  Pre-treatment Symptoms/Complaints:  Much better. Have not really needed the Urabel and think a change in diet has helped. Minimal leakage over the last week and no pain noted  Pain: Initial:   0/10 Post Session:  0/10   Medications Last Reviewed:  01/31/18  Updated Objective Findings:  See evaluation note from today  TTP (B) adductors  3/5 PERF  TTP L OI and ischiocavernosus  TTP abdominals    TREATMENT:   THERAPEUTIC EXERCISE: (10 minutes):  Exercises and SEMG per grid below to improve mobility, strength and coordination.  Required moderate visual, verbal, manual and tactile cues to promote proper body alignment, promote proper body posture and promote proper body breathing techniques.  Progressed resistance, range, repetitions and complexity of movement as indicated.    Exercises:  Patient instructed in pelvic floor exercises listed below:   Date:  01/31/18 Date:  02/04/18 Date:  02/11/18   Activity/Exercise Parameters Parameters Parameters   education HEP, POC, pathology, PT goals     Butterfly stretch 2 mins     kegels with deep breathing 5x     Legs up wall  1 min edu     Childs pose with PF drops  2 min    Happy baby with PF drops  1 min    IR OI stretch  1 min    Clamshells  4 mins prior manual and 4 mins post with PF relaxation 4 mins prior manual and 4 mins post with PF relaxation                 SEMG evaluation (Date TBD): Perry/T       Resting Tone: 10 uV prior to manual and 1.5-2.0 uV post manual       Quality of Resting Tone: increase tone, 10-7 uV -> hesitancy at 1.5-2.0 uV post manual- improved resting tone in sidelying position       5 Second Hold: -  uV       10 Second Hold: - uV       Quality of Recruitment: good      Quality of Relaxation: poor, hesitation->slightly improved post manual       Quality of Holding: 2-3 seconds and hesitation       Stability of Hold: unstable, hesitant       Stability of Rest: hesitant     MANUAL THERAPY: (30 minutes): Joint mobilization and Soft tissue mobilization was utilized and necessary because of the patient's restricted joint motion, painful spasm and restricted motion of soft tissue.   (Used abbreviations: MET - muscle energy technique; SCS- Strain counter strain; CTM- Connective tissue mobilizations; CR- Contract/relax; SP- Sustained pressure, TrP- Trigger point release, IASTM- Instrument assisted soft tissue mobilizations, TDN- Trigger point dry needling).     -Contract-relax release of OI (B) and strumming of L ischiocavernosus   -adductor skin rolling/strumming (NOT TODAY)  -(B) log rolling (NOT TODAY)  -abdominal massage, gentle strumming  -Levator ani release          Pt gives verbal and written consent to internal vaginal assessment/treatment no chaperon present.    Med[employer] Portal  The following educational topics were reviewed with patient:  CLAMSHELL WITH PF DROPS/RELAXATION.  Treatment/Session Summary:     Response to Treatment:  Decrease in PFM tone post manual interventions performed above, with most relax state being in sidelying position. Pt requires PF contraction with glut activation, but able to relax during clamshell contraction.    Communication/Consultation:  PF relaxation with clamshell   Equipment provided today:  HEP handout    Recommendations/Intent for next treatment session: Next visit will focus on Manual interventions as indicated, progression of exercises/ROM as indicated .    Total Treatment Billable Duration:  40 minutes  PT Patient Time In/Time Out  Time In: 0800  Time Out: 0845  Marissa Perry    Future Appointments   Date Time Provider Department Center   02/25/2018  8:00 AM  Marissa Perry [facility]   03/04/2018  8:00 AM Marissa Perry [facility]   03/11/2018  8:00 AM Marissa Perry [facility]   03/24/2018  4:00 PM Marissa Perry, Marissa Joiner, DO [facility]

## 2018-02-18 ENCOUNTER — Encounter: Payer: BLUE CROSS/BLUE SHIELD | Primary: Family Medicine

## 2018-02-19 ENCOUNTER — Encounter: Payer: Self-pay | Admitting: Family Medicine

## 2018-02-19 ENCOUNTER — Ambulatory Visit (INDEPENDENT_AMBULATORY_CARE_PROVIDER_SITE_OTHER): Payer: Self-pay | Admitting: Family Medicine

## 2018-02-19 VITALS — BP 118/56 | HR 61 | Temp 98.4°F | Resp 14 | Ht 65.0 in | Wt 229.0 lb

## 2018-02-19 DIAGNOSIS — R8290 Unspecified abnormal findings in urine: Secondary | ICD-10-CM

## 2018-02-19 DIAGNOSIS — F419 Anxiety disorder, unspecified: Secondary | ICD-10-CM

## 2018-02-19 DIAGNOSIS — Z131 Encounter for screening for diabetes mellitus: Secondary | ICD-10-CM

## 2018-02-19 DIAGNOSIS — Z87442 Personal history of urinary calculi: Secondary | ICD-10-CM

## 2018-02-19 DIAGNOSIS — E049 Nontoxic goiter, unspecified: Secondary | ICD-10-CM

## 2018-02-19 DIAGNOSIS — Z23 Encounter for immunization: Secondary | ICD-10-CM

## 2018-02-19 DIAGNOSIS — F329 Major depressive disorder, single episode, unspecified: Secondary | ICD-10-CM

## 2018-02-19 DIAGNOSIS — R7989 Other specified abnormal findings of blood chemistry: Secondary | ICD-10-CM

## 2018-02-19 LAB — POCT URINALYSIS DIPSTICK
BILIRUBIN UA: NEGATIVE
GLUCOSE UA: NEGATIVE
Ketones, UA: NEGATIVE
Nitrite, UA: NEGATIVE
Protein, UA: NEGATIVE
Urobilinogen, UA: 0.2 E.U./dL
pH, UA: 5 (ref 5.0–8.0)

## 2018-02-19 LAB — POCT GLYCOSYLATED HEMOGLOBIN (HGB A1C): HEMOGLOBIN A1C: 5 % (ref 4.0–5.6)

## 2018-02-19 MED ORDER — BUSPIRONE HCL 5 MG PO TABS
5.0000 mg | ORAL_TABLET | Freq: Two times a day (BID) | ORAL | 5 refills | Status: AC
Start: 2018-02-19 — End: ?

## 2018-02-19 MED FILL — busPIRone HCL 5 MG TABS: 5 | 30 days supply | Qty: 60 | Fill #0

## 2018-02-19 NOTE — Patient Instructions (Addendum)
Recommend scheduling appointment with [facility] [phone].  Address is [address] [place], [place]  For depression and anxiety will start a trial of BuSpar 5 mg twice daily.   We will follow-up by phone with any abnormal laboratory results.  Also, thyroid ultrasound is been scheduled.       Buspirone tablets What is this medicine? BUSPIRONE (byoo SPYE rone) is used to treat anxiety disorders. This medicine may be used for other purposes; ask your health care provider or pharmacist if you have questions. COMMON BRAND NAME(S): BuSpar What should I tell my health care provider before I take this medicine? They need to know if you have any of these conditions: -kidney or liver disease -an unusual or allergic reaction to buspirone, other medicines, foods, dyes, or preservatives -pregnant or trying to get pregnant -breast-feeding How should I use this medicine? Take this medicine by mouth with a glass of water. Follow the directions on the prescription label. You may take this medicine with or without food. To ensure that this medicine always works the same way for you, you should take it either always with or always without food. Take your doses at regular intervals. Do not take your medicine more often than directed. Do not stop taking except on the advice of your doctor or health care professional. Talk to your pediatrician regarding the use of this medicine in children. Special care may be needed. Overdosage: If you think you have taken too much of this medicine contact a poison control center or emergency room at once. NOTE: This medicine is only for you. Do not share this medicine with others. What if I miss a dose? If you miss a dose, take it as soon as you can. If it is almost time for your next dose, take only that dose. Do not take double or extra doses. What may interact with this medicine? Do not take this medicine with any of the following  medications: -linezolid -MAOIs like Carbex, Eldepryl, Marplan, Nardil, and Parnate -methylene blue -procarbazine This medicine may also interact with the following medications: -diazepam -digoxin -diltiazem -erythromycin -grapefruit juice -haloperidol -medicines for mental depression or mood problems -medicines for seizures like carbamazepine, phenobarbital and phenytoin -nefazodone -other medications for anxiety -rifampin -ritonavir -some antifungal medicines like itraconazole, ketoconazole, and voriconazole -verapamil -warfarin This list may not describe all possible interactions. Give your health care provider a list of all the medicines, herbs, non-prescription drugs, or dietary supplements you use. Also tell them if you smoke, drink alcohol, or use illegal drugs. Some items may interact with your medicine. What should I watch for while using this medicine? Visit your doctor or health care professional for regular checks on your progress. It may take 1 to 2 weeks before your anxiety gets better. You may get drowsy or dizzy. Do not drive, use machinery, or do anything that needs mental alertness until you know how this drug affects you. Do not stand or sit up quickly, especially if you are an older patient. This reduces the risk of dizzy or fainting spells. Alcohol can make you more drowsy and dizzy. Avoid alcoholic drinks. What side effects may I notice from receiving this medicine? Side effects that you should report to your doctor or health care professional as soon as possible: -blurred vision or other vision changes -chest pain -confusion -difficulty breathing -feelings of hostility or anger -muscle aches and pains -numbness or tingling in hands or feet -ringing in the ears -skin rash and  itching -vomiting -weakness Side effects that usually do not require medical attention (report to your doctor or health care professional if they continue or are  bothersome): -disturbed dreams, nightmares -headache -nausea -restlessness or nervousness -sore throat and nasal congestion -stomach upset This list may not describe all possible side effects. Call your doctor for medical advice about side effects. You may report side effects to FDA at 1-800-FDA-1088. Where should I keep my medicine? Keep out of the reach of children. Store at room temperature below 30 degrees C (86 degrees F). Protect from light. Keep container tightly closed. Throw away any unused medicine after the expiration date. NOTE: This sheet is a summary. It may not cover all possible information. If you have questions about this medicine, talk to your doctor, pharmacist, or health care provider.  2018 Elsevier/Gold Standard (2010-03-30 18:06:11)

## 2018-02-19 NOTE — Progress Notes (Signed)
Chief Complaint  Patient presents with  . Hyperthyroidism   Subjective:     Patient ID: Ann Haney, female   DOB: 1976-04-04, 42 y.o.   MRN: [id number]  HPI [name], 42 year old female presents accompanied by her husband to establish care.  Patient states that she was seen by an access clinic in [place], but has been lost to follow-up.  She says that she has not establish care due to financial constraints.  She reports a history of abnormal thyroid.  She states that she underwent a thyroid ultrasound which showed multiple nodules.  Patient is currently not being treated for abnormal thyroid.  She endorses hair loss, weight gain, fatigue, and diarrhea.  She denies intolerance to heat or cold, constipation, diaphoresis, or heart palpitations. Patient is complaining of depression and anxiety.  Symptoms include anhedonia, sadness, feelings of hopelessness, racing thoughts, and constant worrying.  She has a family history of depression.  She has not been treated for these problems in the past.  She currently denies suicidal or homicidal ideations. Past Medical History:  Diagnosis Date  . Arthritis    Left Knee  . Renal disorder    kidney stone   Social History   Socioeconomic History  . Marital status: Single    Spouse name: Not on file  . Number of children: Not on file  . Years of education: Not on file  . Highest education level: Not on file  Occupational History  . Not on file  Social Needs  . Financial resource strain: Not on file  . Food insecurity:    Worry: Not on file    Inability: Not on file  . Transportation needs:    Medical: Not on file    Non-medical: Not on file  Tobacco Use  . Smoking status: Current Some Day Smoker  . Smokeless tobacco: Never Used  Substance and Sexual Activity  . Alcohol use: Yes    Comment: occasionally  . Drug use: Yes    Types: Marijuana    Comment: occ  . Sexual activity: Not on file  Lifestyle  . Physical activity:     Days per week: Not on file    Minutes per session: Not on file  . Stress: Not on file  Relationships  . Social connections:    Talks on phone: Not on file    Gets together: Not on file    Attends religious service: Not on file    Active member of club or organization: Not on file    Attends meetings of clubs or organizations: Not on file    Relationship status: Not on file  . Intimate partner violence:    Fear of current or ex partner: Not on file    Emotionally abused: Not on file    Physically abused: Not on file    Forced sexual activity: Not on file  Other Topics Concern  . Not on file  Social History Narrative  . Not on file    There is no immunization history on file for this patient.  Review of Systems  Constitutional: Positive for fatigue and unexpected weight change.  Eyes: Negative for photophobia, redness and visual disturbance.  Respiratory: Negative.   Cardiovascular: Negative.   Gastrointestinal: Negative.  Negative for diarrhea, nausea, rectal pain and vomiting.  Endocrine: Negative.  Negative for polydipsia, polyphagia and polyuria.  Genitourinary: Negative.  Negative for dyspareunia.  Musculoskeletal: Negative.  Negative for myalgias, neck pain and neck stiffness.  Allergic/Immunologic: Negative.  Neurological: Negative.   Hematological: Negative.   Psychiatric/Behavioral: Negative for sleep disturbance and suicidal ideas. The patient is nervous/anxious.        Depression       Objective:   Physical Exam  Constitutional: She is oriented to person, place, and time. She appears well-developed.  HENT:  Head: Normocephalic and atraumatic.  Eyes: Pupils are equal, round, and reactive to light.  Neck: Normal range of motion. Neck supple. Thyromegaly present.  Cardiovascular: Normal rate, regular rhythm and normal heart sounds.  Pulmonary/Chest: Effort normal and breath sounds normal.  Abdominal: Soft. Bowel sounds are normal. She exhibits no  distension. There is no tenderness. There is no guarding.  Musculoskeletal: Normal range of motion.  Neurological: She is alert and oriented to person, place, and time.  Skin: Skin is warm and dry.  Psychiatric: Her behavior is normal. Judgment and thought content normal. Her mood appears anxious. She exhibits a depressed mood.  Tearful       Assessment:       BP (!) 118/56 (BP Location: Left Arm, Patient Position: Sitting, Cuff Size: Normal)   Pulse 61   Temp 98.4 F (36.9 C) (Oral)   Resp 14   Ht 5\' 5"  (1.651 m)   Wt 229 lb (103.9 kg)   LMP 02/04/2018   SpO2 98%   BMI 38.11 kg/m  Plan:        1. Abnormal thyroid blood test - Thyroid Panel With TSH - Basic Metabolic Panel - US THYROID; Future  2. Anxiety and depression GAD 7 : Generalized Anxiety Score 02/19/2018  Nervous, Anxious, on Edge 2  Control/stop worrying 2  Worry too much - different things 2  Trouble relaxing 2  Restless 0  Easily annoyed or irritable 2  Afraid - awful might happen 0  Total GAD 7 Score 10  Anxiety Difficulty Somewhat difficult     [facility]  PHQ-9 Total Score  12     - busPIRone (BUSPAR) 5 MG tablet; Take 1 tablet (5 mg total) by mouth 2 (two) times daily.  Dispense: 60 tablet; Refill: 5  3. History of kidney stones Discussed at length, no symptoms present  4. Need for Tdap vaccination - Tdap vaccine greater than or equal to 7yo IM  5. Enlargement of thyroid - US THYROID  6. Diabetes mellitus screening - HgB A1c   RTC: 1 week for routine gynecological exam   Nolon NationsLachina Moore Jamise Pentland  MSN, FNP-C [facility] [address]  [place], [place] [address] [phone]

## 2018-02-20 LAB — THYROID PANEL WITH TSH
Free Thyroxine Index: 2.1 (ref 1.2–4.9)
T3 Uptake Ratio: 26 % (ref 24–39)
T4 TOTAL: 8.1 ug/dL (ref 4.5–12.0)
TSH: 0.701 u[IU]/mL (ref 0.450–4.500)

## 2018-02-20 LAB — BASIC METABOLIC PANEL
BUN/Creatinine Ratio: 14 (ref 9–23)
BUN: 10 mg/dL (ref 6–24)
CO2: 22 mmol/L (ref 20–29)
CREATININE: 0.71 mg/dL (ref 0.57–1.00)
Calcium: 9.5 mg/dL (ref 8.7–10.2)
Chloride: 102 mmol/L (ref 96–106)
GFR calc Af Amer: 121 mL/min/{1.73_m2} (ref >59)
GFR, EST NON AFRICAN AMERICAN: 105 mL/min/{1.73_m2} (ref >59)
Glucose: 85 mg/dL (ref 65–99)
Potassium: 4.1 mmol/L (ref 3.5–5.2)
SODIUM: 138 mmol/L (ref 134–144)

## 2018-02-21 ENCOUNTER — Ambulatory Visit (HOSPITAL_COMMUNITY): Admission: RE | Admit: 2018-02-21 | Payer: Self-pay | Source: Ambulatory Visit

## 2018-02-21 LAB — URINE CULTURE

## 2018-02-25 ENCOUNTER — Encounter: Payer: BLUE CROSS/BLUE SHIELD | Primary: Family Medicine

## 2018-02-27 ENCOUNTER — Ambulatory Visit: Payer: Self-pay | Admitting: Family Medicine

## 2018-03-04 NOTE — Progress Notes (Signed)
Zoe LanChristina H Blassingame  DOB: [birth year]  Primary: [employer]  Secondary:  [facility] [place]  [address], [place] [id number]  Phone:[phone]   [phone]        OUTPATIENT DAILY NOTE    NAME/AGE/GENDER: Zoe LanChristina H Scroggs is a 42 y.o. female.     DATE: 03/04/2018    Ms. Emond NO SHOWED for today's appointment.  An attempt to contact Ms. Katrinka BlazingSmith was made and patient unavailable left message to call back.  This is her 1st recent no show.    Shane Melby Sandi MealyN Markesha Hannig

## 2018-03-04 NOTE — Other (Addendum)
Marissa Perry  DOB: [birth year]  Primary: [name]  Secondary:  [facility]  [address], [place]  Phone:[phone]   [phone]        OUTPATIENT PHYSICAL THERAPY:Discontinuation Summary 03/04/2018   ICD-10: Treatment Diagnosis:     Stress incontinence (female) (female) (N39.3)  Muscle weakness (generalized) (M62.81)  Unspecified lack of coordination (R27.9)  Precautions/Allergies:   Codeine   TREATMENT PLAN:  Effective Dates: 01/31/2018 TO 05/01/2018 (90 days).  Frequency/Duration: 1 time a week for 90 Day(s) MEDICAL/REFERRING DIAGNOSIS:  High-tone pelvic floor dysfunction   DATE OF ONSET: about 3 weeks ago  REFERRING PHYSICIAN: [name], DO  MD Orders: evaluate and treat  Return MD Appointment: 03/22/18     Marissa Perry has been seen in physical therapy from 01/31/18 to 03/04/18.  Treatment has been discontinued at this time due to Patient called and stated she was doing well and wished to discontinue at this time..  The below goals were met prior to discontinuation.   Some goals were not met due to pt not returning for PT.   Thank you for this referral.         INITIAL ASSESSMENT:  Ms. Eager presents to physical therapy with c/o anterior pelvic pain with urge to urinate and stress incontinence. Pt demonstrates weakness and poor coordination of PFM contraction, as well as increase tone in PFMs. These S/S are indicative of high-tone PF dysfunction, SUI and IC and weakness of PFM.  Patient will benefit from manual interventions (soft tissue mobilizations, joint mobilizations, TDN, MET,etc.) as indicated, therapeutic exercises/activities as indicated, and biofeedback for neuromuscular re-education to improve patient's quality of life and functional mobility.     1.  1.      GOALS: (Goals have been discussed and agreed upon with patient.)  Short-Term Functional Goals: Time Frame: 6 weeks   2. Patient will be independent and compliant with HEP  (met)  3. Patient will tolerate manual interventions (joint mobilizations, STM, TDN, trigger point release, PROM, etc.) to improve joint ROM/soft tissue mobility of restricted structures to improve functional mobility.   4. Patient will report <2/10 pain with functional ADLs and the urge to urinate to improve quality of life (met)  5. Patient will report <1 pad a day used secondary to incontinence to demonstrate decrease in incontinence  Discharge Goals: Time Frame: 12 weeks  2. Pt will be independent with comprehensive HEP (met)  3. Pt will report <1/10 pain with functional ADLs for improved mobility and the urge to urinate.  4. Pt will be able to demonstrate >3/5 PFM contraction X>5 second hold 5x to demonstrate increase in PFM contraction     Outcome Measure:   Tool Used: Pelvic Floor Impact Questionnaire???short form 7 (PFIQ-7)   Score:  Initial:   ?? Bladder or Urine: 3  ?? Bowel or Rectum: 0  ?? Vagina or Pelvis: 0 Most Recent: X (Date: -- )  ?? Bladder or Urine: X  ?? Bowel or Rectum: X  ?? Vagina or Pelvis: X   Interpretation of Score: Each of the 7 sections is scored on a scale from 0-3; 0 representing "Not at all", 3 representing "Quite a bit". The mean value is taken from all the answered items, then multiplied by 100 to obtain the scale score, ranging from 0-100.  This process is repeated for each column representing bowel, bladder, and pelvic pain.  Tool Used: Pelvic Floor Disability Index (PFDI-20)  Score:  Initial: 3/70 Most Recent: X (Date: -- )   Interpretation of Score:  This survey asks questions concerning certain  bowel, bladder, or pelvic symptoms and how much this symptoms interfere with daily activiites.  Each section is scored on a 0-4 scale, 4 representing the greatest disability.  The scores of each section are added together for a total score out of 70.        Medical Necessity:    ?? Patient is expected to demonstrate progress in strength, coordination and functional technique to decrease pain with urge to urinate and ADLs and decrease urinary stress incontinence to improve overall quality of life.  Reason for Services/Other Comments:  ?? Patient requries therapeutic interventions to improve her soft tissue mobility, strength, coordination to improve quality of life and increase ability to complete ADL without pain.  Total Duration: Evaluation 25 minutes, treatment 25 minutes                        Richarda Blade PT, DPT

## 2018-03-04 NOTE — Progress Notes (Signed)
Progress  Notes by Hadley Pen at 03/04/18 1337                Author: Hadley Pen  Service: --  Author Type: Physical Therapist       Filed: 03/04/18 1338  Date of Service: 03/04/18 1337  Status: Signed          Editor: Hadley Pen (Physical Therapist)                   Zoe Lan   DOB: [birth year]   Primary: [facility] [place]   Secondary:   [facility]   [address] [place] [address]   [phone]   Fax:[phone]               OUTPATIENT DAILY NOTE      NAME/AGE/GENDER: KELEAH HANSFORD  is a 42 y.o. female.       DATE: 03/04/2018      Ms. Sookram NO SHOWED for today's  appointment.  An attempt to contact Ms. Ellestad  was made and patient unavailable left message to call back.  This is her 1st recent no show.      Kylie Sandi Mealy

## 2018-03-04 NOTE — Discharge Instructions (Addendum)
Therapy  Discharge by [name] at 03/04/18 2359                Author: [name]  Service: --  Author Type: Physical Therapist       Filed: 06/20/18 1021  Date of Service: 03/04/18 2359  Status: Addendum          Editor: [name] (Physical Therapist)          Related Notes: Original Note by [name] (Physical Therapist) filed at 06/20/18 [name]   DOB: 30-Mar-1976   Primary: [facility] [place]   Secondary:   Marissa Perry at [facility]   [address], [place]   Phone:[phone]   [phone]                    OUTPATIENT PHYSICAL THERAPY:Discontinuation Summary 03/04/2018      ICD-10: Treatment Diagnosis:       Stress incontinence (female) (female) (N39.3)   Muscle weakness (generalized) (M62.81)   Unspecified lack of coordination (R27.9)   Precautions/Allergies:    Codeine    TREATMENT PLAN:   Effective Dates: 01/31/2018 TO 05/01/2018 (90 days).  Frequency/Duration : 1 time a week for 90 Day(s)  MEDICAL/REFERRING DIAGNOSIS:   High-tone pelvic floor dysfunction    DATE OF ONSET: about 3 weeks ago   REFERRING PHYSICIAN: [name], DO   MD Orders: evaluate and treat   Return MD Appointment: 03/22/18           Marissa Perry has been seen in physical therapy from  01/31/18 to 03/04/18.  Treatment has been discontinued at this time due to  Patient called and stated she was doing well and wished to discontinue at this time..  The below goals were met prior to discontinuation.   Some goals were not met due to  pt not returning for PT.   Thank you for this referral.            INITIAL ASSESSMENT:  Marissa Perry  presents to physical therapy with c/o anterior pelvic pain with urge to urinate and stress incontinence. Pt demonstrates weakness and poor coordination of PFM contraction, as well as increase tone in PFMs. These S/S are indicative of high-tone PF dysfunction,  SUI and IC and weakness of PFM.  Patient  will benefit from manual interventions (soft tissue mobilizations, joint mobilizations, TDN, MET,etc.) as indicated, therapeutic exercises/activities as indicated, and biofeedback for neuromuscular re-education  to improve patient's quality of life and functional mobility.       1.       1.            GOALS: (Goals have been discussed and agreed upon with patient.)   Short-Term Functional Goals: Time Frame: 6 weeks   2.  Patient will be independent and compliant with HEP  (met)   3.  Patient will tolerate manual interventions (joint mobilizations, STM, TDN, trigger point release, PROM, etc.) to improve joint ROM/soft tissue mobility of restricted structures to improve functional mobility.    4.  Patient will report <2/10 pain with functional ADLs and the urge to urinate to improve quality of life (met)   5.  Patient will report <1 pad a day used secondary to incontinence to demonstrate decrease in incontinence   Discharge Goals: Time Frame: 12 weeks  2.  Pt will be independent with comprehensive HEP (met)   3.  Pt will report <1/10 pain with functional ADLs for improved mobility and the urge to urinate.   4.  Pt will be able to demonstrate >3/5 PFM contraction X>5 second hold 5x to demonstrate increase in PFM contraction       Outcome Measure:    Tool Used: Pelvic Floor Impact Questionnaire--short form 7 (PFIQ-7)    Score:   Initial:      Bladder or Urine: 3     Bowel or Rectum: 0     Vagina or Pelvis: 0  Most Recent: X (Date: -- )     Bladder or Urine: X     Bowel or Rectum: X     Vagina or Pelvis: X        Interpretation of Score:  Each of the 7 sections is scored on a scale from 0-3; 0 representing "Not at all", 3 representing "Quite a bit". The mean value is taken from all the answered items, then multiplied by 100 to obtain the scale score, ranging from 0-100.  This process is  repeated for each column representing bowel, bladder, and pelvic pain.            Tool Used: Pelvic Floor Disability Index  (PFDI-20)   Score:   Initial: 3/70  Most Recent: X (Date: -- )        Interpretation of Score:   This survey asks questions concerning certain   bowel, bladder, or pelvic symptoms and how much this symptoms interfere with daily activiites.   Each section is scored on a 0-4 scale, 4  representing the greatest disability.  The scores of each section are added together for a total score out of  70.           Medical Necessity:      Patient is expected to demonstrate progress in strength, coordination and functional technique to decrease pain with urge to  urinate and ADLs and decrease urinary stress incontinence to improve overall quality of life.   Reason for Services/Other Comments:     Patient requries therapeutic interventions to improve her soft tissue mobility, strength, coordination to improve quality of  life and increase ability to complete ADL without pain.   Total Duration: Evaluation 25 minutes, treatment 25 minutes                                           Richarda Blade PT, DPT

## 2018-03-05 ENCOUNTER — Inpatient Hospital Stay: Payer: BLUE CROSS/BLUE SHIELD | Primary: Family Medicine

## 2018-03-11 ENCOUNTER — Encounter: Payer: BLUE CROSS/BLUE SHIELD | Primary: Family Medicine

## 2018-03-20 ENCOUNTER — Encounter: Attending: Obstetrics & Gynecology | Primary: Family Medicine

## 2018-03-24 ENCOUNTER — Encounter

## 2018-03-24 ENCOUNTER — Encounter: Attending: Obstetrics & Gynecology | Primary: Family Medicine

## 2018-05-09 ENCOUNTER — Inpatient Hospital Stay: Admit: 2018-05-09 | Payer: BLUE CROSS/BLUE SHIELD | Attending: Obstetrics & Gynecology | Primary: Family Medicine

## 2018-05-09 DIAGNOSIS — Z1231 Encounter for screening mammogram for malignant neoplasm of breast: Secondary | ICD-10-CM

## 2018-11-11 ENCOUNTER — Encounter (HOSPITAL_COMMUNITY): Payer: Self-pay | Admitting: Emergency Medicine

## 2018-11-11 ENCOUNTER — Ambulatory Visit (HOSPITAL_COMMUNITY)
Admission: EM | Admit: 2018-11-11 | Discharge: 2018-11-11 | Disposition: A | Discharge status: Home / Self Care | Payer: Self-pay | Attending: Family Medicine | Admitting: Family Medicine

## 2018-11-11 DIAGNOSIS — M222X2 Patellofemoral disorders, left knee: Secondary | ICD-10-CM

## 2018-11-11 MED ORDER — NAPROXEN 500 MG PO TABS: 500.0000 mg | ORAL_TABLET | Freq: Two times a day (BID) | ORAL | 0 refills | Status: AC

## 2018-11-11 NOTE — ED Provider Notes (Signed)
[facility]    CSN: [id number] Arrival date & time: 11/11/18  1738     History   Chief Complaint Chief Complaint  Patient presents with  . Knee Pain    HPI Ann Haney is a 43 y.o. female.   She is presenting with left anterior knee pain for the past 2 weeks.  The pain is intermittent in nature.  Pain is localized to the knee.  It is more throbbing in nature.  Has tried some anti-inflammatories with no improvement.  Denies any inciting event or injury.  Pain is moderate to severe.  She feels like it may give out on her at some time.  HPI  Past Medical History:  Diagnosis Date  . Arthritis    Left Knee  . Renal disorder    kidney stone    There are no active problems to display for this patient.   Past Surgical History:  Procedure Laterality Date  . TUBAL LIGATION      OB History   No obstetric history on file.      Home Medications    Prior to Admission medications   Medication Sig Start Date End Date Taking? Authorizing Provider  Aspirin-Salicylamide-Caffeine (ARTHRITIS STRENGTH BC POWDER PO) Take 1 packet by mouth every 6 (six) hours as needed (For knee pain.).     [provider]  busPIRone (BUSPAR) 5 MG tablet Take 1 tablet (5 mg total) by mouth 2 (two) times daily. 02/19/18   Massie Maroon, FNP  Multiple Vitamin (MULTIVITAMIN WITH MINERALS) TABS Take 1 tablet by mouth every morning.     [provider]  naproxen (NAPROSYN) 500 MG tablet Take 1 tablet (500 mg total) by mouth 2 (two) times daily with a meal. 11/11/18 11/11/19  Myra Rude, MD    Family History No family history on file.  Social History Social History   Tobacco Use  . Smoking status: Current Some Day Smoker  . Smokeless tobacco: Never Used  Substance Use Topics  . Alcohol use: Yes    Comment: occasionally  . Drug use: Yes    Types: Marijuana    Comment: occ     Allergies   Codeine   Review of Systems Review of Systems    Constitutional: Negative for fever.  HENT: Negative for congestion.   Respiratory: Negative for cough.   Cardiovascular: Negative for chest pain.  Gastrointestinal: Negative for abdominal pain.  Musculoskeletal: Positive for gait problem.  Skin: Negative for color change.  Neurological: Negative for weakness.  Hematological: Negative for adenopathy.  Psychiatric/Behavioral: Negative for agitation.     Physical Exam Triage Vital Signs ED Triage Vitals  Enc Vitals Group     BP 11/11/18 1809 121/71     Pulse Rate 11/11/18 1809 60     Resp 11/11/18 1809 18     Temp 11/11/18 1809 98.2 F (36.8 C)     Temp src --      SpO2 11/11/18 1809 98 %     Weight --      Height --      Head Circumference --      Peak Flow --      Pain Score 11/11/18 1810 5     Pain Loc --      Pain Edu? --      Excl. in GC? --    No data found.  Updated Vital Signs BP 121/71 (BP Location: Right Arm)   Pulse 60   Temp  98.2 F (36.8 C)   Resp 18   LMP 11/09/2018   SpO2 98%   Visual Acuity Right Eye Distance:   Left Eye Distance:   Bilateral Distance:    Right Eye Near:   Left Eye Near:    Bilateral Near:     Physical Exam Gen: NAD, alert, cooperative with exam, well-appearing ENT: normal lips, normal nasal mucosa,  Eye: normal EOM, normal conjunctiva and lids CV:  no edema, +2 pedal pulses   Resp: no accessory muscle use, non-labored,  Skin: no rashes, no areas of induration  Neuro: normal tone, normal sensation to touch Psych:  normal insight, alert and oriented MSK:  Left knee: Normal to inspection with no erythema or effusion or obvious bony abnormalities. Palpation normal with no warmth,  Mild tenderness to palpation of the patellar tendon and medial joint line. ROM full in flexion and extension and lower leg rotation. Ligaments with solid consistent endpoints including , LCL, MCL. Negative Mcmurray's tests. Non painful patellar compression. Patellar glide without  crepitus. Patellar and quadriceps tendons unremarkable. Hamstring and quadriceps strength is normal.  Neurovascular intact     UC Treatments / Results  Labs (all labs ordered are listed, but only abnormal results are displayed) Labs Reviewed - No data to display  EKG None  Radiology No results found.  Procedures Procedures (including critical care time)  Medications Ordered in UC Medications - No data to display  Initial Impression / Assessment and Plan / UC Course  I have reviewed the triage vital signs and the nursing notes.  Pertinent labs & imaging results that were available during my care of the patient were reviewed by me and considered in my medical decision making (see chart for details).     Ann Haney is a 43 year old female is presenting with left anterior knee pain.  No inciting event and symptoms likely related to patellofemoral syndrome.  No effusion on exam today.  Will provide naproxen.  Counseled on home exercise therapy and supportive care.  If no improvement will consider physical therapy and imaging.  Could consider injection.  Final Clinical Impressions(s) / UC Diagnoses   Final diagnoses:  Patellofemoral pain syndrome of left knee     Discharge Instructions     Please try the exercises Please try tylenol  Please try naproxen for 5-7 days then as needed  Please follow up if your symptoms fail to improve.     ED Prescriptions    Medication Sig Dispense Auth. Provider   naproxen (NAPROSYN) 500 MG tablet Take 1 tablet (500 mg total) by mouth 2 (two) times daily with a meal. 60 tablet Myra Rude, MD     Controlled Substance Prescriptions [place] Controlled Substance Registry consulted? Not Applicable   Myra Rude, MD 11/11/18 2130

## 2018-11-11 NOTE — ED Triage Notes (Signed)
Pt c/o lt knee pain since Saturday, denies injury

## 2018-11-11 NOTE — Discharge Instructions (Signed)
Please try the exercises Please try tylenol  Please try naproxen for 5-7 days then as needed  Please follow up if your symptoms fail to improve.

## 2019-06-10 ENCOUNTER — Encounter: Attending: Obstetrics & Gynecology | Primary: Family Medicine

## 2019-06-10 NOTE — Progress Notes (Deleted)
HPI:  [name] is a 43 y.o. female G5 [id number] who is here today for a well woman exam. She complains of ***       Date Performed Result   PAP 01-17-16 Neg HPV neg   Mammogram 2019    Colonoscopy ***    Dexa ***        OB History     Gravida   5    Para   4    Term   4    Preterm        AB   1    Living   4       SAB   1    TAB        Ectopic        Molar        Multiple        Live Births   4          Obstetric Comments   3 vaginal deliveries  1 C-Section           GYN History     Patient's last menstrual period was 03/01/2014 (exact date). negative dysmenorrhea; negative postcoital bleeding        Past Medical History:   Diagnosis Date   ??? Abnormal uterine bleeding (AUB) 03/23/2014   ??? Adenomyosis    ??? Anxiety     no present treatment    ??? [facility] and CT [facility]    ??? Enlarged uterus    ??? History of DVT (deep vein thrombosis) 03/23/2014   ??? Menorrhagia    ??? Psychiatric disorder    ??? Pulmonary embolism (HCC)    ??? Severe obesity (HCC) 01/01/2018   ??? 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 Unspecified adverse effect of anesthesia     has woken up after anesthesia "violent" in the past; pt states just due stressfull situations     Past Surgical History:   Procedure Laterality Date   ??? [facility]    C[facility] - C7 with hardware; Dr. Kirstie PeriBucci   ??? HX CESAREAN SECTION  1993   ??? HX COLONOSCOPY  12/2013   ??? HX DILATION AND CURETTAGE      due to miscarriage   ??? HX ENDOSCOPY  12/2013   ??? HX TOTAL LAPAROSCOPIC HYSTERECTOMY     ??? HX WISDOM TEETH EXTRACTION         Allergies   Allergen Reactions   ??? Codeine Nausea Only     Blurry vision       Current Outpatient Medications   Medication Sig Dispense Refill   ??? mth-me blue-sod phos-phsal-hyo (URIBEL) 118-10-40.8-36 mg cap capsule Take 1 Cap by mouth four (4) times daily. 120 Cap 12   ??? tolterodine ER (DETROL LA) 4 mg ER capsule Take 1 Cap by mouth daily.  Indications: overactive bladder 30 Cap 2   ??? dabigatran etexilate (PRADAXA) 75 mg capsule Take 75 mg by mouth every twelve (12) hours.     ??? escitalopram oxalate (LEXAPRO) 10 mg tablet Take 10 mg by mouth daily.           Social History     Socioeconomic History   ??? Marital status: MARRIED     Spouse name: Not on file   ??? Number of children: Not on file   ???  Years of education: Not on file   ??? Highest education level: Not on file   Occupational History   ??? Not on file   Social Needs   ??? Financial resource strain: Not on file   ??? Food insecurity     Worry: Not on file     Inability: Not on file   ??? Transportation needs     Medical: Not on file     Non-medical: Not on file   Tobacco Use   ??? Smoking status: Former Smoker     Packs/day: 0.50     Years: 6.00     Pack years: 3.00   ??? Smokeless tobacco: Never Used   ??? Tobacco comment: stopped in 2013   Substance and Sexual Activity   ??? Alcohol use: Yes     Comment: Rare   ??? Drug use: Yes     Types: Marijuana     Comment: occasional use of marijuana   ??? Sexual activity: Yes     Partners: Male     Birth control/protection: Surgical     Comment: Hysterectomy   Lifestyle   ??? Physical activity     Days per week: Not on file     Minutes per session: Not on file   ??? Stress: Not on file   Relationships   ??? Social Product manager on phone: Not on file     Gets together: Not on file     Attends religious service: Not on file     Active member of club or organization: Not on file     Attends meetings of clubs or organizations: Not on file     Relationship status: Not on file   ??? Intimate partner violence     Fear of current or ex partner: Not on file     Emotionally abused: Not on file     Physically abused: Not on file     Forced sexual activity: Not on file   Other Topics Concern   ??? Not on file   Social History Narrative   ??? Not on file     Family History   Problem Relation Age of Onset   ??? Hypertension Father    ??? Hypertension Mother    ??? No Known Problems Sister     ??? No Known Problems Brother    ??? No Known Problems Sister    ??? No Known Problems Sister    ??? No Known Problems Brother    ??? Breast Cancer Maternal Grandmother 60   ??? Heart Disease Maternal Grandmother    ??? Hypertension Maternal Grandmother    ??? Alzheimer Maternal Grandmother    ??? Diabetes Paternal Grandmother    ??? Heart Disease Paternal Grandmother    ??? Hypertension Paternal Grandmother    ??? Heart Attack Maternal Grandfather    ??? Other Paternal Grandfather         Heart attack or blood clot       ROS     Visit Vitals  LMP 03/01/2014 (Exact Date)      Results for orders placed or performed in visit on 01/23/18   AMB POC URINALYSIS DIP STICK MANUAL W/O MICRO     Status: None   Result Value Ref Range Status    Color (UA POC) Yellow  Final    Clarity (UA POC) Clear  Final    Glucose (UA POC) Negative Negative Final    Bilirubin (UA POC) Negative  Negative Final    Ketones (UA POC) Negative Negative Final    Specific gravity (UA POC) 1.020 1.001 - 1.035 Final    Blood (UA POC) Negative Negative Final    pH (UA POC) 7.0 4.6 - 8.0 Final    Protein (UA POC) Negative Negative Final    Urobilinogen (UA POC) 0.2 mg/dL 0.2 - 1 Final    Nitrites (UA POC) Negative Negative Final    Leukocyte esterase (UA POC) Negative Negative Final   Results for orders placed or performed in visit on 01/01/18   AMB POC URINALYSIS DIP STICK AUTO W/O MICRO     Status: None   Result Value Ref Range Status    Color (UA POC) Yellow  Final    Clarity (UA POC) Clear  Final    Glucose (UA POC) Negative Negative Final    Bilirubin (UA POC) Negative Negative Final    Ketones (UA POC) Negative Negative Final    Specific gravity (UA POC) 1.020 1.001 - 1.035 Final    Blood (UA POC) Negative Negative Final    pH (UA POC) 7.0 4.6 - 8.0 Final    Protein (UA POC) Negative Negative Final    Urobilinogen (UA POC) 0.2 mg/dL 0.2 - 1 Final    Nitrites (UA POC) Negative Negative Final    Leukocyte esterase (UA POC) Negative Negative Final    Results for orders placed or performed in visit on 06/07/14   AMB POC URINALYSIS DIP STICK AUTO W/ MICRO (PGU)     Status: None   Result Value Ref Range Status    Glucose (UA POC) Negative Negative mg/dL Final    Bilirubin (UA POC) Negative Negative Final    Ketones (UA POC) Negative Negative Final    Specific gravity (UA POC) 1.020 1.001 - 1.035 Final    Blood (UA POC) Negative Negative Final    pH (UA POC) 7.0 4.6 - 8.0 Final    Protein (UA POC) Negative Negative Final    Urobilinogen (POC) 0.2 mg/dL  Final    Nitrites (UA POC) Negative Negative Final    Leukocyte esterase (UA POC) Negative Negative Final       Physical Exam    Assessment/Plan  {There are no diagnoses linked to this encounter. (Refresh or delete this SmartLink)}         Brandi Alt D.O

## 2020-06-29 ENCOUNTER — Ambulatory Visit: Payer: Self-pay | Admitting: Family Medicine

## 2020-06-29 ENCOUNTER — Encounter: Payer: Self-pay | Admitting: Family Medicine

## 2020-06-29 ENCOUNTER — Other Ambulatory Visit: Payer: Self-pay

## 2020-06-29 DIAGNOSIS — Z113 Encounter for screening for infections with a predominantly sexual mode of transmission: Secondary | ICD-10-CM

## 2020-06-29 LAB — WET PREP FOR TRICH, YEAST, CLUE
Trichomonas Exam: NEGATIVE
Yeast Exam: NEGATIVE

## 2020-06-29 NOTE — Progress Notes (Signed)
Here today for STD screening. Accepts bloodwork. Terina Mcelhinny, RN ° °

## 2020-06-29 NOTE — Progress Notes (Signed)
[place] results reviewed. Per standing orders no treatment indicated. Lenord Fralix, RN  

## 2020-06-29 NOTE — Progress Notes (Signed)
[facility] Department STI clinic/screening visit  Subjective:  Ann Haney is a 44 y.o. female being seen today for an STI screening visit. The patient reports they do have symptoms.  Patient reports that they do not desire a pregnancy in the next year.   They reported they are not interested in discussing contraception today.  Patient's last menstrual period was 06/20/2020 (exact date).  Client has a BTL   Patient has the following medical conditions:  There are no problems to display for this patient.   Chief Complaint  Patient presents with  . SEXUALLY TRANSMITTED DISEASE    Screening    HPI  Patient reports x 2-3 week ago noted a whiteish discharge, small amount with an odor.  Denies other symptoms.   Last HIV test per patient/review of record was 2020 Patient reports last pap was 20 years ago.   See flowsheet for further details and programmatic requirements.    The following portions of the patient's history were reviewed and updated as appropriate: allergies, current medications, past medical history, past social history, past surgical history and problem list.  Objective:  There were no vitals filed for this visit.  Physical Exam Vitals and nursing note reviewed.  Constitutional:      Appearance: Normal appearance.  HENT:     Head: Normocephalic and atraumatic.     Mouth/Throat:     Mouth: Mucous membranes are moist.     Pharynx: Oropharynx is clear. No oropharyngeal exudate or posterior oropharyngeal erythema.  Pulmonary:     Effort: Pulmonary effort is normal.  Abdominal:     General: Abdomen is flat.     Palpations: There is no mass.     Tenderness: There is no abdominal tenderness. There is no rebound.  Genitourinary:    General: Normal vulva.     Exam position: Lithotomy position.     Pubic Area: No rash or pubic lice.      Labia:        Right: No rash or lesion.        Left: No rash or lesion.      Vagina: Normal. No vaginal  discharge, erythema, bleeding or lesions.     Cervix: No cervical motion tenderness, discharge, friability, lesion or erythema.     Uterus: Normal.      Adnexa: Right adnexa normal and left adnexa normal.     Rectum: Normal.     Comments: Small amount white discharge, pH , 4.5 Lymphadenopathy:     Head:     Right side of head: No preauricular or posterior auricular adenopathy.     Left side of head: No preauricular or posterior auricular adenopathy.     Cervical: No cervical adenopathy.     Upper Body:     Right upper body: No supraclavicular or axillary adenopathy.     Left upper body: No supraclavicular or axillary adenopathy.     Lower Body: No right inguinal adenopathy. No left inguinal adenopathy.  Skin:    General: Skin is warm and dry.     Findings: No rash.  Neurological:     Mental Status: She is alert and oriented to person, place, and time.     Assessment and Plan:  Ann Haney is a 44 y.o. female presenting to the [facility] Department for STI screening  1. Screening examination for venereal disease  - WET PREP FOR TRICH, YEAST, CLUE - Chlamydia/Gonorrhea [place] [facility] - HIV [place] [facility] -  Syphilis Serology, [facility]  Client given PCP resources information.   No follow-ups on file.  No future appointments.  Larene Pickett, FNP

## 2020-11-16 ENCOUNTER — Encounter: Attending: Obstetrics & Gynecology | Primary: Family Medicine

## 2021-05-01 ENCOUNTER — Ambulatory Visit (HOSPITAL_COMMUNITY)
Admission: EM | Admit: 2021-05-01 | Discharge: 2021-05-01 | Disposition: A | Discharge status: Home / Self Care | Payer: Self-pay | Attending: Emergency Medicine | Admitting: Emergency Medicine

## 2021-05-01 ENCOUNTER — Encounter (HOSPITAL_COMMUNITY): Payer: Self-pay | Admitting: Emergency Medicine

## 2021-05-01 DIAGNOSIS — J069 Acute upper respiratory infection, unspecified: Secondary | ICD-10-CM

## 2021-05-01 MED ORDER — PROMETHAZINE-DM 6.25-15 MG/5ML PO SYRP: 5.0000 mL | ORAL_SOLUTION | Freq: Four times a day (QID) | ORAL | 0 refills | Status: DC | PRN

## 2021-05-01 MED ORDER — BENZONATATE 100 MG PO CAPS: 200.0000 mg | ORAL_CAPSULE | Freq: Three times a day (TID) | ORAL | 0 refills | Status: AC | PRN

## 2021-05-01 NOTE — ED Triage Notes (Signed)
Pt presents with cough and congestion xs 4 days.

## 2021-05-01 NOTE — Discharge Instructions (Addendum)
You can take the Tessalon perles as needed for cough.  You can also take the Promethazine DM as needed for cough at night.  Do not drive after taking the Promethazine DM as it can make you sleepy.   You can take Tylenol and/or Ibuprofen as needed for fever reduction and pain relief.   For cough: honey 1/2 to 1 teaspoon (you can dilute the honey in water or another fluid).  You can also use guaifenesin and dextromethorphan for cough. You can use a humidifier for chest congestion and cough.  If you don't have a humidifier, you can sit in the bathroom with the hot shower running.     For sore throat: try warm salt water gargles, cepacol lozenges, throat spray, warm tea or water with lemon/honey, popsicles or ice, or OTC cold relief medicine for throat discomfort.    For congestion: take a daily anti-histamine like Zyrtec, Claritin, and a oral decongestant, such as pseudoephedrine.  You can also use Flonase 1-2 sprays in each nostril daily.    It is important to stay hydrated: drink plenty of fluids (water, gatorade/powerade/pedialyte, juices, or teas) to keep your throat moisturized and help further relieve irritation/discomfort.   Return or go to the Emergency Department if symptoms worsen or do not improve in the next few days.

## 2021-05-01 NOTE — ED Provider Notes (Signed)
[facility]    CSN: [id number] Arrival date & time: 05/01/21  1428      History   Chief Complaint Chief Complaint  Patient presents with   Cough   Nasal Congestion    HPI Ann Haney is a 45 y.o. female.   Patient here for evaluation of cough and congestion for the past 4 days.  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  Has not taken any OTC medications or treatments.  Denies any trauma, injury, or other precipitating event.  Denies any specific alleviating or aggravating factors.  Denies any fevers, chest pain, shortness of breath, N/V/D, numbness, tingling, weakness, abdominal pain, or headaches.    The history is provided by the patient.  Cough Associated symptoms: sore throat   Associated symptoms: no fever    Past Medical History:  Diagnosis Date   Arthritis    Left Knee   Renal disorder    kidney stone    There are no problems to display for this patient.   Past Surgical History:  Procedure Laterality Date   TUBAL LIGATION      OB History   No obstetric history on file.      Home Medications    Prior to Admission medications   Medication Sig Start Date End Date Taking? Authorizing Provider  benzonatate (TESSALON PERLES) 100 MG capsule Take 2 capsules (200 mg total) by mouth 3 (three) times daily as needed for cough. 05/01/21  Yes Ivette Loyal, NP  promethazine-dextromethorphan (PROMETHAZINE-DM) 6.25-15 MG/5ML syrup Take 5 mLs by mouth 4 (four) times daily as needed for cough. 05/01/21  Yes Ivette Loyal, NP  Aspirin-Salicylamide-Caffeine (ARTHRITIS STRENGTH BC POWDER PO) Take 1 packet by mouth every 6 (six) hours as needed (For knee pain.).  Patient not taking: Reported on 06/29/2020    [provider]  busPIRone (BUSPAR) 5 MG tablet Take 1 tablet (5 mg total) by mouth 2 (two) times daily. Patient not taking: Reported on 06/29/2020 02/19/18   Massie Maroon, FNP  Multiple Vitamin (MULTIVITAMIN  WITH MINERALS) TABS Take 1 tablet by mouth every morning.     [provider]    Family History History reviewed. No pertinent family history.  Social History Social History   Tobacco Use   Smoking status: Former    Types: Cigarettes    Quit date: 04/03/2020    Years since quitting: 1.0   Smokeless tobacco: Never  Vaping Use   Vaping Use: Former  Substance Use Topics   Alcohol use: Yes    Comment: occasionally   Drug use: Yes    Types: Marijuana    Comment: occ     Allergies   Codeine   Review of Systems Review of Systems  Constitutional:  Negative for fatigue and fever.  HENT:  Positive for congestion and sore throat. Negative for trouble swallowing.   Respiratory:  Positive for cough.   All other systems reviewed and are negative.   Physical Exam Triage Vital Signs ED Triage Vitals  Enc Vitals Group     BP 05/01/21 1527 138/86     Pulse Rate 05/01/21 1527 (!) 58     Resp 05/01/21 1527 16     Temp 05/01/21 1527 98.5 F (36.9 C)     Temp Source 05/01/21 1527 Oral     SpO2 05/01/21 1527 96 %     Weight --      Height --  Head Circumference --      Peak Flow --      Pain Score 05/01/21 1525 0     Pain Loc --      Pain Edu? --      Excl. in GC? --    No data found.  Updated Vital Signs BP 138/86 (BP Location: Right Wrist)   Pulse (!) 58   Temp 98.5 F (36.9 C) (Oral)   Resp 16   LMP 04/12/2021   SpO2 96%   Visual Acuity Right Eye Distance:   Left Eye Distance:   Bilateral Distance:    Right Eye Near:   Left Eye Near:    Bilateral Near:     Physical Exam Vitals and nursing note reviewed.  Constitutional:      General: She is not in acute distress.    Appearance: Normal appearance. She is not ill-appearing, toxic-appearing or diaphoretic.  HENT:     Head: Normocephalic and atraumatic.     Nose: Congestion present.     Right Turbinates: Swollen.     Left Turbinates: Swollen.     Right Sinus: No maxillary sinus tenderness or  frontal sinus tenderness.     Left Sinus: No maxillary sinus tenderness or frontal sinus tenderness.     Mouth/Throat:     Pharynx: Uvula midline. Posterior oropharyngeal erythema present. No pharyngeal swelling or uvula swelling.     Tonsils: No tonsillar exudate or tonsillar abscesses. 0 on the right. 0 on the left.  Eyes:     Conjunctiva/sclera: Conjunctivae normal.  Cardiovascular:     Rate and Rhythm: Normal rate.     Pulses: Normal pulses.     Heart sounds: Normal heart sounds.  Pulmonary:     Effort: Pulmonary effort is normal.     Breath sounds: Normal breath sounds.  Abdominal:     General: Abdomen is flat.  Musculoskeletal:        General: Normal range of motion.     Cervical back: Normal range of motion.  Skin:    General: Skin is warm and dry.  Neurological:     General: No focal deficit present.     Mental Status: She is alert and oriented to person, place, and time.  Psychiatric:        Mood and Affect: Mood normal.     UC Treatments / Results  Labs (all labs ordered are listed, but only abnormal results are displayed) Labs Reviewed - No data to display  EKG   Radiology No results found.  Procedures Procedures (including critical care time)  Medications Ordered in UC Medications - No data to display  Initial Impression / Assessment and Plan / UC Course  I have reviewed the triage vital signs and the nursing notes.  Pertinent labs & imaging results that were available during my care of the patient were reviewed by me and considered in my medical decision making (see chart for details).    Viral URI with cough.  Declines COVID testing stating that she will just do an at home test.  May take Tessalon Perles and Promethazine DM as needed for cough.  Instructed not to take Promethazine DM prior to driving as it can cause drowsiness.  Tylenol and/or ibuprofen as needed.  Encourage fluids and rest.  Discussed conservative symptom management as described in  discharge instructions.  Follow-up as needed Final Clinical Impressions(s) / UC Diagnoses   Final diagnoses:  Viral URI with cough     Discharge Instructions  You can take the Tessalon perles as needed for cough.  You can also take the Promethazine DM as needed for cough at night.  Do not drive after taking the Promethazine DM as it can make you sleepy.   You can take Tylenol and/or Ibuprofen as needed for fever reduction and pain relief.   For cough: honey 1/2 to 1 teaspoon (you can dilute the honey in water or another fluid).  You can also use guaifenesin and dextromethorphan for cough. You can use a humidifier for chest congestion and cough.  If you don't have a humidifier, you can sit in the bathroom with the hot shower running.     For sore throat: try warm salt water gargles, cepacol lozenges, throat spray, warm tea or water with lemon/honey, popsicles or ice, or OTC cold relief medicine for throat discomfort.    For congestion: take a daily anti-histamine like Zyrtec, Claritin, and a oral decongestant, such as pseudoephedrine.  You can also use Flonase 1-2 sprays in each nostril daily.    It is important to stay hydrated: drink plenty of fluids (water, gatorade/powerade/pedialyte, juices, or teas) to keep your throat moisturized and help further relieve irritation/discomfort.   Return or go to the Emergency Department if symptoms worsen or do not improve in the next few days.      ED Prescriptions     Medication Sig Dispense Auth. Provider   benzonatate (TESSALON PERLES) 100 MG capsule Take 2 capsules (200 mg total) by mouth 3 (three) times daily as needed for cough. 20 capsule Ivette Loyal, NP   promethazine-dextromethorphan (PROMETHAZINE-DM) 6.25-15 MG/5ML syrup Take 5 mLs by mouth 4 (four) times daily as needed for cough. 118 mL Ivette Loyal, NP      PDMP not reviewed this encounter.   Ivette Loyal, NP 05/01/21 [phone]

## 2021-05-08 ENCOUNTER — Ambulatory Visit (HOSPITAL_COMMUNITY)
Admission: EM | Admit: 2021-05-08 | Discharge: 2021-05-08 | Disposition: A | Discharge status: Home / Self Care | Payer: Self-pay | Attending: Family Medicine | Admitting: Family Medicine

## 2021-05-08 ENCOUNTER — Other Ambulatory Visit: Payer: Self-pay

## 2021-05-08 ENCOUNTER — Encounter (HOSPITAL_COMMUNITY): Payer: Self-pay | Admitting: *Deleted

## 2021-05-08 DIAGNOSIS — J0100 Acute maxillary sinusitis, unspecified: Secondary | ICD-10-CM

## 2021-05-08 DIAGNOSIS — H6593 Unspecified nonsuppurative otitis media, bilateral: Secondary | ICD-10-CM

## 2021-05-08 MED ORDER — PREDNISONE 50 MG PO TABS: ORAL_TABLET | ORAL | 0 refills | Status: AC

## 2021-05-08 MED ORDER — AMOXICILLIN-POT CLAVULANATE 875-125 MG PO TABS: 1.0000 | ORAL_TABLET | Freq: Two times a day (BID) | ORAL | 0 refills | Status: DC

## 2021-05-08 NOTE — ED Provider Notes (Signed)
[facility]    CSN: [id number] Arrival date & time: 05/08/21  1023      History   Chief Complaint Chief Complaint  Patient presents with   Nasal Congestion    HPI Ann Haney is a 45 y.o. female.   Patient presenting today with 10 to 12-day history of congestion, cough, ear pain and pressure.  States the cough was initially the most bothersome symptom and she was seen about a week ago for this, COVID test was negative and she was given cough medicine with some relief of the cough.  She states that symptoms have now moved to her sinuses and her ears and she has been significantly congested with a sinus headache, facial pain and pressure, congestion.  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  Taking nasal sprays, cold medication with minimal relief.   Past Medical History:  Diagnosis Date   Arthritis    Left Knee   Renal disorder    kidney stone    There are no problems to display for this patient.   Past Surgical History:  Procedure Laterality Date   TUBAL LIGATION      OB History   No obstetric history on file.      Home Medications    Prior to Admission medications   Medication Sig Start Date End Date Taking? Authorizing Provider  amoxicillin-clavulanate (AUGMENTIN) 875-125 MG tablet Take 1 tablet by mouth every 12 (twelve) hours. 05/08/21  Yes Particia Nearing, PA-C  predniSONE (DELTASONE) 50 MG tablet Take 1 tab daily with breakfast for 3 days 05/08/21  Yes Particia Nearing, PA-C  Aspirin-Salicylamide-Caffeine (ARTHRITIS STRENGTH BC POWDER PO) Take 1 packet by mouth every 6 (six) hours as needed (For knee pain.).  Patient not taking: Reported on 06/29/2020    [provider]  benzonatate (TESSALON PERLES) 100 MG capsule Take 2 capsules (200 mg total) by mouth 3 (three) times daily as needed for cough. 05/01/21   Ivette Loyal, NP  busPIRone  (BUSPAR) 5 MG tablet Take 1 tablet (5 mg total) by mouth 2 (two) times daily. Patient not taking: Reported on 06/29/2020 02/19/18   Massie Maroon, FNP  Multiple Vitamin (MULTIVITAMIN WITH MINERALS) TABS Take 1 tablet by mouth every morning.     [provider]  promethazine-dextromethorphan (PROMETHAZINE-DM) 6.25-15 MG/5ML syrup Take 5 mLs by mouth 4 (four) times daily as needed for cough. 05/01/21   Ivette Loyal, NP    Family History History reviewed. No pertinent family history.  Social History Social History   Tobacco Use   Smoking status: Former    Types: Cigarettes    Quit date: 04/03/2020    Years since quitting: 1.0   Smokeless tobacco: Never  Vaping Use   Vaping Use: Former  Substance Use Topics   Alcohol use: Yes    Comment: occasionally   Drug use: Yes    Types: Marijuana    Comment: occ     Allergies   Codeine   Review of Systems Review of Systems Per HPI  Physical Exam Triage Vital Signs ED Triage Vitals  Enc Vitals Group     BP 05/08/21 1254 113/74     Pulse Rate 05/08/21 1254 84     Resp 05/08/21 1254 20     Temp 05/08/21 1254 99 F (37.2 C)     Temp src --  SpO2 05/08/21 1254 92 %     Weight --      Height --      Head Circumference --      Peak Flow --      Pain Score 05/08/21 1250 0     Pain Loc --      Pain Edu? --      Excl. in GC? --    No data found.  Updated Vital Signs BP 113/74   Pulse 84   Temp 99 F (37.2 C)   Resp 20   LMP 04/12/2021   SpO2 92%   Visual Acuity Right Eye Distance:   Left Eye Distance:   Bilateral Distance:    Right Eye Near:   Left Eye Near:    Bilateral Near:     Physical Exam Vitals and nursing note reviewed.  Constitutional:      Appearance: Normal appearance. She is not ill-appearing.  HENT:     Head: Atraumatic.     Ears:     Comments: Bilateral middle ear effusions    Nose: Congestion present.     Mouth/Throat:     Mouth: Mucous membranes are moist.     Pharynx:  Posterior oropharyngeal erythema present.  Eyes:     Extraocular Movements: Extraocular movements intact.     Conjunctiva/sclera: Conjunctivae normal.  Cardiovascular:     Rate and Rhythm: Normal rate and regular rhythm.     Heart sounds: Normal heart sounds.  Pulmonary:     Effort: Pulmonary effort is normal. No respiratory distress.     Breath sounds: Normal breath sounds. No wheezing or rales.  Abdominal:     General: Bowel sounds are normal. There is no distension.     Palpations: Abdomen is soft.     Tenderness: There is no abdominal tenderness. There is no guarding.  Musculoskeletal:        General: Normal range of motion.     Cervical back: Normal range of motion and neck supple.  Skin:    General: Skin is warm and dry.  Neurological:     Mental Status: She is alert and oriented to person, place, and time.  Psychiatric:        Mood and Affect: Mood normal.        Thought Content: Thought content normal.        Judgment: Judgment normal.   UC Treatments / Results  Labs (all labs ordered are listed, but only abnormal results are displayed) Labs Reviewed - No data to display  EKG   Radiology No results found.  Procedures Procedures (including critical care time)  Medications Ordered in UC Medications - No data to display  Initial Impression / Assessment and Plan / UC Course  I have reviewed the triage vital signs and the nursing notes.  Pertinent labs & imaging results that were available during my care of the patient were reviewed by me and considered in my medical decision making (see chart for details).     Given duration and worsening course, will treat for sinusitis and otitis with effusion with a prednisone burst, Augmentin.  Discussed continue nasal sprays, sinus rinses, Mucinex.  Work note given.  Follow-up if worsening or not resolving.  Final Clinical Impressions(s) / UC Diagnoses   Final diagnoses:  Acute non-recurrent maxillary sinusitis  Otitis  media with effusion, bilateral   Discharge Instructions   None    ED Prescriptions     Medication Sig Dispense Auth. Provider  predniSONE (DELTASONE) 50 MG tablet Take 1 tab daily with breakfast for 3 days 3 tablet Particia Nearing, PA-C   amoxicillin-clavulanate (AUGMENTIN) 875-125 MG tablet Take 1 tablet by mouth every 12 (twelve) hours. 14 tablet Particia Nearing, [place]      PDMP not reviewed this encounter.   Particia Nearing, [place] 05/08/21 1407

## 2021-05-08 NOTE — ED Triage Notes (Signed)
Pt last seen on 05-01-21. Pt continues to have congestion. Pt report Sx's worse after her last flight.

## 2021-05-08 NOTE — ED Triage Notes (Signed)
NEG home COVID last week.

## 2021-05-08 NOTE — ED Notes (Signed)
No answer when phone number on file was called. No answer in lobby

## 2022-10-15 ENCOUNTER — Ambulatory Visit
Admission: EM | Admit: 2022-10-15 | Discharge: 2022-10-15 | Disposition: A | Discharge status: Home / Self Care | Payer: BLUE CROSS/BLUE SHIELD | Attending: Urgent Care | Admitting: Urgent Care

## 2022-10-15 DIAGNOSIS — N23 Unspecified renal colic: Secondary | ICD-10-CM | POA: Diagnosis present

## 2022-10-15 DIAGNOSIS — R109 Unspecified abdominal pain: Secondary | ICD-10-CM

## 2022-10-15 HISTORY — DX: Calculus of kidney: N20.0

## 2022-10-15 LAB — POCT URINALYSIS DIP (MANUAL ENTRY)
Bilirubin, UA: NEGATIVE
Glucose, UA: NEGATIVE mg/dL
Ketones, POC UA: NEGATIVE mg/dL
Nitrite, UA: NEGATIVE
Protein Ur, POC: NEGATIVE mg/dL
Spec Grav, UA: >=1.03 — AB (ref 1.010–1.025)
Urobilinogen, UA: 0.2 E.U./dL
pH, UA: 5.5 (ref 5.0–8.0)

## 2022-10-15 MED ORDER — ACETAMINOPHEN 325 MG PO TABS: 650.0000 mg | ORAL_TABLET | Freq: Four times a day (QID) | ORAL | 0 refills | Status: AC | PRN

## 2022-10-15 MED ORDER — KETOROLAC TROMETHAMINE 30 MG/ML IJ SOLN
30.0000 mg | Freq: Once | INTRAMUSCULAR | Status: AC
  Administered 2022-10-15: 30 mg via INTRAMUSCULAR

## 2022-10-15 MED ORDER — TAMSULOSIN HCL 0.4 MG PO CAPS: 0.4000 mg | ORAL_CAPSULE | Freq: Every day | ORAL | 0 refills | Status: AC

## 2022-10-15 MED ORDER — ONDANSETRON HCL 4 MG/2ML IJ SOLN
4.0000 mg | Freq: Once | INTRAMUSCULAR | Status: AC
  Administered 2022-10-15: 4 mg via INTRAVENOUS

## 2022-10-15 MED ORDER — SODIUM CHLORIDE 0.9 % IV BOLUS
1000.0000 mL | Freq: Once | INTRAVENOUS | Status: AC
  Administered 2022-10-15: 1000 mL via INTRAVENOUS

## 2022-10-15 NOTE — Discharge Instructions (Addendum)
Please start Flomax to help with urinary flow. I suspect you have a kidney stone.  Make sure you hydrate very well with plain water and a quantity of 80 ounces of water a day.  Please limit drinks that are considered urinary irritants such as soda, sweet tea, coffee, energy drinks, alcohol.  These can worsen your urinary and genital symptoms but also be the source of them.  I will let you know about your urine culture results through MyChart to see if we need to prescribe or change your antibiotics based off of those results.  If you develop fever, vomiting, pain along the mid back over the left side come back as this could be a sign of kidney infection.

## 2022-10-15 NOTE — ED Triage Notes (Signed)
Pt c/o left flank pain started yesterday am-pain worse when she stands-NAD-steady gait

## 2022-10-15 NOTE — ED Provider Notes (Signed)
[facility]  Note:  This document was prepared using Systems analyst and may include unintentional dictation errors.  MRN: [id number] DOB: 06-21-1976  Subjective:   Ann Haney is a 47 y.o. female presenting for 1 day history of acute onset left flank pain, is waxing and waning, is moderate to severe, at its worst prevents her from standing upright. Has a history of renal stones. No fever, vomiting, hematuria, dysuria, diarrhea, urinary frequency or urgency. Had a bowel movement yesterday. No falls, trauma. Still has cycles, is not currently on one. Does not hydrate well, has ~1 bottle of water a day. Has coffee, soda, juice, energy drinks daily.   No current facility-administered medications for this encounter.  Current Outpatient Medications:    amoxicillin-clavulanate (AUGMENTIN) 875-125 MG tablet, Take 1 tablet by mouth every 12 (twelve) hours., Disp: 14 tablet, Rfl: 0   Aspirin-Salicylamide-Caffeine (ARTHRITIS STRENGTH BC POWDER PO), Take 1 packet by mouth every 6 (six) hours as needed (For knee pain.).  (Patient not taking: Reported on 06/29/2020), Disp: , Rfl:    benzonatate (TESSALON PERLES) 100 MG capsule, Take 2 capsules (200 mg total) by mouth 3 (three) times daily as needed for cough., Disp: 20 capsule, Rfl: 0   busPIRone (BUSPAR) 5 MG tablet, Take 1 tablet (5 mg total) by mouth 2 (two) times daily. (Patient not taking: Reported on 06/29/2020), Disp: 60 tablet, Rfl: 5   Multiple Vitamin (MULTIVITAMIN WITH MINERALS) TABS, Take 1 tablet by mouth every morning. , Disp: , Rfl:    predniSONE (DELTASONE) 50 MG tablet, Take 1 tab daily with breakfast for 3 days, Disp: 3 tablet, Rfl: 0   promethazine-dextromethorphan (PROMETHAZINE-DM) 6.25-15 MG/5ML syrup, Take 5 mLs by mouth 4 (four) times daily as needed for cough., Disp: 118 mL, Rfl: 0   Allergies  Allergen Reactions   Codeine Nausea And Vomiting    Past Medical History:  Diagnosis  Date   Arthritis    Left Knee   Kidney calculi    Renal disorder    kidney stone     Past Surgical History:  Procedure Laterality Date   TUBAL LIGATION      No family history on file.  Social History   Tobacco Use   Smoking status: Former    Types: Cigarettes    Quit date: 04/03/2020    Years since quitting: 2.5   Smokeless tobacco: Never  Vaping Use   Vaping Use: Every day  Substance Use Topics   Alcohol use: Not Currently   Drug use: Not Currently    ROS   Objective:   Vitals: BP 107/73 (BP Location: Left Arm)   Pulse 64   Temp 98 F (36.7 C) (Oral)   Resp 18   LMP 10/02/2022 (Approximate)   SpO2 96%   Physical Exam Constitutional:      General: She is in acute distress (from her left flank pain).     Appearance: Normal appearance. She is well-developed. She is not ill-appearing, toxic-appearing or diaphoretic.  HENT:     Head: Normocephalic and atraumatic.     Nose: Nose normal.     Mouth/Throat:     Mouth: Mucous membranes are moist.  Eyes:     General: No scleral icterus.       Right eye: No discharge.        Left eye: No discharge.     Extraocular Movements: Extraocular movements intact.     Conjunctiva/sclera: Conjunctivae normal.  Cardiovascular:  Rate and Rhythm: Normal rate and regular rhythm.     Heart sounds: Normal heart sounds. No murmur heard.    No friction rub. No gallop.  Pulmonary:     Effort: Pulmonary effort is normal. No respiratory distress.     Breath sounds: No stridor. No wheezing, rhonchi or rales.  Chest:     Chest wall: No tenderness.  Abdominal:     General: Bowel sounds are normal. There is no distension.     Palpations: Abdomen is soft. There is no mass.     Tenderness: There is no abdominal tenderness. There is no right CVA tenderness, left CVA tenderness, guarding or rebound.    Skin:    General: Skin is warm and dry.  Neurological:     General: No focal deficit present.     Mental Status: She is alert  and oriented to person, place, and time.  Psychiatric:        Mood and Affect: Mood normal.        Behavior: Behavior normal.        Thought Content: Thought content normal.        Judgment: Judgment normal.    Results for orders placed or performed during the hospital encounter of 10/15/22 (from the past 24 hour(s))  POCT urinalysis dipstick     Status: Abnormal   Collection Time: 10/15/22  9:06 AM  Result Value Ref Range   Color, UA yellow yellow   Clarity, UA clear clear   Glucose, UA negative negative mg/dL   Bilirubin, UA negative negative   Ketones, POC UA negative negative mg/dL   Spec Grav, UA >=1.030 (A) 1.010 - 1.025   Blood, UA moderate (A) negative   pH, UA 5.5 5.0 - 8.0   Protein Ur, POC negative negative mg/dL   Urobilinogen, UA 0.2 0.2 or 1.0 E.U./dL   Nitrite, UA Negative Negative   Leukocytes, UA Moderate (2+) (A) Negative   IV fluid bolus of 1000 cc normal saline administered over period of 50 minutes.  IV Zofran 4 mg, IV Toradol 30 mg administered in clinic.  Assessment and Plan :   PDMP not reviewed this encounter.  1. Renal colic   2. Left flank pain     Will defer antibiotic use for now as she had no CVA tenderness, is afebrile and has no urinary symptoms.  High suspicion for renal colic.  Recommended starting Flomax, hydrating aggressively and avoiding urinary irritants. Counseled patient on potential for adverse effects with medications prescribed/recommended today, ER and return-to-clinic precautions discussed, patient verbalized understanding.    Jaynee Eagles, PA-C 10/15/22 0930

## 2022-10-16 LAB — URINE CULTURE: Culture: >=100000 — AB

## 2022-10-17 ENCOUNTER — Telehealth (HOSPITAL_COMMUNITY): Payer: Self-pay | Admitting: Emergency Medicine

## 2022-10-17 LAB — URINE CULTURE

## 2022-10-17 MED ORDER — CEFDINIR 300 MG PO CAPS: 300.0000 mg | ORAL_CAPSULE | Freq: Two times a day (BID) | ORAL | 0 refills | Status: AC

## 2022-10-17 MED ORDER — CEPHALEXIN 500 MG PO CAPS: 500.0000 mg | ORAL_CAPSULE | Freq: Two times a day (BID) | ORAL | 0 refills | Status: DC

## 2023-03-26 ENCOUNTER — Ambulatory Visit
Admission: EM | Admit: 2023-03-26 | Discharge: 2023-03-26 | Disposition: A | Discharge status: Home / Self Care | Payer: BLUE CROSS/BLUE SHIELD | Attending: Physician Assistant | Admitting: Physician Assistant

## 2023-03-26 ENCOUNTER — Ambulatory Visit: Payer: BLUE CROSS/BLUE SHIELD

## 2023-03-26 DIAGNOSIS — M25562 Pain in left knee: Secondary | ICD-10-CM | POA: Diagnosis not present

## 2023-03-26 MED ORDER — MELOXICAM 15 MG PO TABS: 15.0000 mg | ORAL_TABLET | Freq: Every day | ORAL | 2 refills | Status: AC

## 2023-03-26 MED ORDER — HYDROCODONE-ACETAMINOPHEN 5-325 MG PO TABS: 1.0000 | ORAL_TABLET | ORAL | 0 refills | Status: AC | PRN

## 2023-03-26 NOTE — ED Triage Notes (Signed)
Pt presents with c/o lt knee pain. States Sunday it has worsened and reports nothing she has taken has helped.

## 2023-03-26 NOTE — ED Provider Notes (Signed)
[facility]    CSN: [id number] Arrival date & time: 03/26/23  1807      History   Chief Complaint Chief Complaint  Patient presents with   Knee Pain    HPI Ann Haney is a 47 y.o. female.   Patient complains of severe left knee pain.  Patient reports the pain became worse about a week ago.  Patient reports she has been told in the past that she had a problem with her knee however pain has recently become unbearable.  Patient has not been seen by an orthopedist she denies any recent imaging   Knee Pain   Past Medical History:  Diagnosis Date   Arthritis    Left Knee   Kidney calculi    Renal disorder    kidney stone    There are no problems to display for this patient.   Past Surgical History:  Procedure Laterality Date   TUBAL LIGATION      OB History   No obstetric history on file.      Home Medications    Prior to Admission medications   Medication Sig Start Date End Date Taking? Authorizing Provider  HYDROcodone-acetaminophen (NORCO/VICODIN) 5-325 MG tablet Take 1 tablet by mouth every 4 (four) hours as needed for moderate pain. 03/26/23 03/25/24 Yes Elson Areas, PA-C  meloxicam (MOBIC) 15 MG tablet Take 1 tablet (15 mg total) by mouth daily. 03/26/23 03/25/24 Yes Elson Areas, PA-C  acetaminophen (TYLENOL) 325 MG tablet Take 2 tablets (650 mg total) by mouth every 6 (six) hours as needed for moderate pain. 10/15/22   Wallis Bamberg, PA-C  Aspirin-Salicylamide-Caffeine (ARTHRITIS STRENGTH BC POWDER PO) Take 1 packet by mouth every 6 (six) hours as needed (For knee pain.).  Patient not taking: Reported on 06/29/2020    [provider]  benzonatate (TESSALON PERLES) 100 MG capsule Take 2 capsules (200 mg total) by mouth 3 (three) times daily as needed for cough. 05/01/21   Ivette Loyal, NP  busPIRone (BUSPAR) 5 MG tablet Take 1 tablet (5 mg total) by mouth 2 (two) times daily. Patient not taking: Reported on 06/29/2020 02/19/18    Massie Maroon, FNP  Multiple Vitamin (MULTIVITAMIN WITH MINERALS) TABS Take 1 tablet by mouth every morning.     [provider]  predniSONE (DELTASONE) 50 MG tablet Take 1 tab daily with breakfast for 3 days 05/08/21   Particia Nearing, PA-C  promethazine-dextromethorphan (PROMETHAZINE-DM) 6.25-15 MG/5ML syrup Take 5 mLs by mouth 4 (four) times daily as needed for cough. 05/01/21   Ivette Loyal, NP  tamsulosin (FLOMAX) 0.4 MG CAPS capsule Take 1 capsule (0.4 mg total) by mouth daily after supper. 10/15/22   Wallis Bamberg, PA-C    Family History History reviewed. No pertinent family history.  Social History Social History   Tobacco Use   Smoking status: Former    Current packs/day: 0.00    Types: Cigarettes    Quit date: 04/03/2020    Years since quitting: 2.9   Smokeless tobacco: Never  Vaping Use   Vaping status: Every Day  Substance Use Topics   Alcohol use: Not Currently   Drug use: Not Currently     Allergies   Codeine   Review of Systems Review of Systems  All other systems reviewed and are negative.    Physical Exam Triage Vital Signs ED Triage Vitals  Encounter Vitals Group     BP 03/26/23 1823 118/64  Systolic BP Percentile --      Diastolic BP Percentile --      Pulse Rate 03/26/23 1822 79     Resp 03/26/23 1822 18     Temp 03/26/23 1822 98.5 F (36.9 C)     Temp Source 03/26/23 1822 Oral     SpO2 03/26/23 1822 98 %     Weight --      Height --      Head Circumference --      Peak Flow --      Pain Score 03/26/23 1821 10     Pain Loc --      Pain Education --      Exclude from Growth Chart --    No data found.  Updated Vital Signs BP 118/64   Pulse 79   Temp 98.5 F (36.9 C) (Oral)   Resp 18   LMP 03/11/2023 (Exact Date)   SpO2 98%   Visual Acuity Right Eye Distance:   Left Eye Distance:   Bilateral Distance:    Right Eye Near:   Left Eye Near:    Bilateral Near:     Physical Exam Vitals and nursing note  reviewed.  Constitutional:      Appearance: She is well-developed.  HENT:     Head: Normocephalic.  Cardiovascular:     Rate and Rhythm: Normal rate.  Pulmonary:     Effort: Pulmonary effort is normal.  Abdominal:     General: There is no distension.  Musculoskeletal:        General: Swelling present.     Comments: Tender left knee decreased range of motion to pain neurovascular neurosensory are intact  Skin:    General: Skin is warm.  Neurological:     General: No focal deficit present.     Mental Status: She is alert and oriented to person, place, and time.  Psychiatric:        Mood and Affect: Mood normal.      UC Treatments / Results  Labs (all labs ordered are listed, but only abnormal results are displayed) Labs Reviewed - No data to display  EKG   Radiology DG Knee 2 Views Left  Result Date: 03/26/2023 CLINICAL DATA:  Chronic left knee pain. EXAM: LEFT KNEE - 1-2 VIEW COMPARISON:  None Available. FINDINGS: Mildly decreased bone mineralization. Moderate to severe medial compartment joint space narrowing with large peripheral degenerative osteophytes. Mild-to-moderate peripheral medial degenerative osteophytosis without significant joint space narrowing. Moderate anterior and posterior proximal tibial degenerative osteophytes. Severe patellofemoral joint space narrowing and peripheral osteophytosis. Small joint effusion. No acute fracture or dislocation. 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 Electronically Signed   By: Neita Garnet M.D.   On: 03/26/2023 19:31    Procedures Procedures (including critical care time)  Medications Ordered in UC Medications - No data to display  Initial Impression / Assessment and Plan / UC Course  I have reviewed the triage vital signs and the nursing notes.  Pertinent labs & imaging results that were available during my care of the patient were reviewed by me and considered in my  medical decision making (see chart for details).     MDM: Patient is counseled on results of x-ray.  He is given a prescription for meloxicam and hydrocodone.  Patient is advised to call the orthopedist to schedule to be seen for further evaluation. Final Clinical Impressions(s) / UC Diagnoses   Final diagnoses:  Acute pain of left  knee   Discharge Instructions   None    ED Prescriptions     Medication Sig Dispense Auth. Provider   meloxicam (MOBIC) 15 MG tablet Take 1 tablet (15 mg total) by mouth daily. 30 tablet Cheron Schaumann K, [place]   HYDROcodone-acetaminophen (NORCO/VICODIN) 5-325 MG tablet Take 1 tablet by mouth every 4 (four) hours as needed for moderate pain. 15 tablet Elson Areas, [place]      I have reviewed the PDMP during this encounter. An After Visit Summary was printed and given to the patient.       Elson Areas, [place] 03/26/23 2017

## 2023-03-27 ENCOUNTER — Telehealth: Payer: Self-pay

## 2023-03-27 NOTE — Telephone Encounter (Signed)
Pharmacy called , [facility] [phone], HYDROcodone-acetaminophen (NORCO/VICODIN) 5-325 MG tablet is currently on backorder.   The pharmacy has the following medications in stock: HYDROcodone-acetaminophen: 7.5mg  and 10mg  Percocet: 5mg  Plain Oxycodone: 5mg     [id number] - [place], [place] - [address]

## 2024-02-12 ENCOUNTER — Ambulatory Visit
Admission: EM | Admit: 2024-02-12 | Discharge: 2024-02-12 | Disposition: A | Discharge status: Home / Self Care | Attending: Family Medicine | Admitting: Family Medicine

## 2024-02-12 DIAGNOSIS — B349 Viral infection, unspecified: Secondary | ICD-10-CM

## 2024-02-12 LAB — POC COVID19/FLU A&B COMBO
Covid Antigen, POC: NEGATIVE
Influenza A Antigen, POC: NEGATIVE
Influenza B Antigen, POC: NEGATIVE

## 2024-02-12 MED ORDER — IBUPROFEN 600 MG PO TABS: 600.0000 mg | ORAL_TABLET | Freq: Four times a day (QID) | ORAL | 0 refills | Status: AC | PRN

## 2024-02-12 MED ORDER — CETIRIZINE HCL 10 MG PO TABS: 10.0000 mg | ORAL_TABLET | Freq: Every day | ORAL | 0 refills | Status: AC

## 2024-02-12 MED ORDER — PROMETHAZINE-DM 6.25-15 MG/5ML PO SYRP: 5.0000 mL | ORAL_SOLUTION | Freq: Three times a day (TID) | ORAL | 0 refills | Status: AC | PRN

## 2024-02-12 MED ORDER — PSEUDOEPHEDRINE HCL 30 MG PO TABS: 30.0000 mg | ORAL_TABLET | Freq: Three times a day (TID) | ORAL | 0 refills | Status: AC | PRN

## 2024-02-12 MED ORDER — LOPERAMIDE HCL 2 MG PO CAPS: 2.0000 mg | ORAL_CAPSULE | Freq: Two times a day (BID) | ORAL | 0 refills | Status: AC | PRN

## 2024-02-12 NOTE — ED Provider Notes (Signed)
 [facility]  Note:  This document was prepared using Conservation officer, historic buildings and may include unintentional dictation errors.  MRN: [id number] DOB: [birth year]  Subjective:   Ann Haney is a 48 y.o. female presenting for 4-day history of malaise, fatigue, coughing, sinus congestion, throat congestion, chest congestion, diarrhea, sinus headaches.  No shortness of breath, wheezing, confusion, weakness, numbness or tingling, nausea, vomiting, bloody stools. No fever, recent antibiotic use, hospitalizations or long distance travel.  Has not eaten raw foods, drank unfiltered water.  No history of GI disorders including Crohn's, IBS, ulcerative colitis.  No asthma.  No smoking of any kind including cigarettes, cigars, vaping, marijuana use.    No current facility-administered medications for this encounter.  Current Outpatient Medications:    acetaminophen  (TYLENOL ) 325 MG tablet, Take 2 tablets (650 mg total) by mouth every 6 (six) hours as needed for moderate pain., Disp: 30 tablet, Rfl: 0   Aspirin-Salicylamide-Caffeine (ARTHRITIS STRENGTH BC POWDER PO), Take 1 packet by mouth every 6 (six) hours as needed (For knee pain.).  (Patient not taking: Reported on 06/29/2020), Disp: , Rfl:    benzonatate  (TESSALON  PERLES) 100 MG capsule, Take 2 capsules (200 mg total) by mouth 3 (three) times daily as needed for cough., Disp: 20 capsule, Rfl: 0   busPIRone  (BUSPAR ) 5 MG tablet, Take 1 tablet (5 mg total) by mouth 2 (two) times daily. (Patient not taking: Reported on 06/29/2020), Disp: 60 tablet, Rfl: 5   HYDROcodone -acetaminophen  (NORCO/VICODIN) 5-325 MG tablet, Take 1 tablet by mouth every 4 (four) hours as needed for moderate pain., Disp: 15 tablet, Rfl: 0   meloxicam  (MOBIC ) 15 MG tablet, Take 1 tablet (15 mg total) by mouth daily., Disp: 30 tablet, Rfl: 2   Multiple Vitamin (MULTIVITAMIN WITH MINERALS) TABS, Take 1 tablet by mouth every morning. , Disp: , Rfl:     predniSONE  (DELTASONE ) 50 MG tablet, Take 1 tab daily with breakfast for 3 days, Disp: 3 tablet, Rfl: 0   promethazine -dextromethorphan (PROMETHAZINE -DM) 6.25-15 MG/5ML syrup, Take 5 mLs by mouth 4 (four) times daily as needed for cough., Disp: 118 mL, Rfl: 0   tamsulosin  (FLOMAX ) 0.4 MG CAPS capsule, Take 1 capsule (0.4 mg total) by mouth daily after supper., Disp: 30 capsule, Rfl: 0   Allergies  Allergen Reactions   Codeine Nausea And Vomiting    Past Medical History:  Diagnosis Date   Arthritis    Left Knee   Kidney calculi    Renal disorder    kidney stone     Past Surgical History:  Procedure Laterality Date   TUBAL LIGATION      No family history on file.  Social History   Tobacco Use   Smoking status: Former    Current packs/day: 0.00    Types: Cigarettes    Quit date: 04/03/2020    Years since quitting: 3.8   Smokeless tobacco: Never  Vaping Use   Vaping status: Former  Substance Use Topics   Alcohol use: Not Currently   Drug use: Not Currently    ROS   Objective:   Vitals: BP 137/83 (BP Location: Right Arm)   Pulse 80   Temp 98.8 F (37.1 C) (Oral)   Resp 20   SpO2 97%   Physical Exam Constitutional:      General: She is not in acute distress.    Appearance: Normal appearance. She is well-developed and normal weight. She is not ill-appearing, toxic-appearing or diaphoretic.  HENT:  Head: Normocephalic and atraumatic.     Right Ear: Tympanic membrane, ear canal and external ear normal. No drainage or tenderness. No middle ear effusion. There is no impacted cerumen. Tympanic membrane is not erythematous or bulging.     Left Ear: Tympanic membrane, ear canal and external ear normal. No drainage or tenderness.  No middle ear effusion. There is no impacted cerumen. Tympanic membrane is not erythematous or bulging.     Nose: Congestion present. No rhinorrhea.     Mouth/Throat:     Mouth: Mucous membranes are moist. No oral lesions.     Pharynx:  No pharyngeal swelling, oropharyngeal exudate, posterior oropharyngeal erythema or uvula swelling.     Tonsils: No tonsillar exudate or tonsillar abscesses.  Eyes:     General: No scleral icterus.       Right eye: No discharge.        Left eye: No discharge.     Extraocular Movements: Extraocular movements intact.     Right eye: Normal extraocular motion.     Left eye: Normal extraocular motion.     Conjunctiva/sclera: Conjunctivae normal.  Cardiovascular:     Rate and Rhythm: Normal rate and regular rhythm.     Heart sounds: Normal heart sounds. No murmur heard.    No friction rub. No gallop.  Pulmonary:     Effort: Pulmonary effort is normal. No respiratory distress.     Breath sounds: No stridor. No wheezing, rhonchi or rales.  Chest:     Chest wall: No tenderness.  Abdominal:     General: Bowel sounds are increased.  Musculoskeletal:     Cervical back: Normal range of motion and neck supple.  Lymphadenopathy:     Cervical: No cervical adenopathy.  Skin:    General: Skin is warm and dry.  Neurological:     General: No focal deficit present.     Mental Status: She is alert and oriented to person, place, and time.     Cranial Nerves: No cranial nerve deficit.     Motor: No weakness.     Coordination: Coordination normal.     Gait: Gait normal.     Deep Tendon Reflexes: Reflexes normal.  Psychiatric:        Mood and Affect: Mood normal.        Behavior: Behavior normal.     Results for orders placed or performed during the hospital encounter of 02/12/24 (from the past 24 hours)  POC Covid19/Flu A&B Antigen     Status: None   Collection Time: 02/12/24 12:22 PM  Result Value Ref Range   Influenza A Antigen, POC Negative Negative   Influenza B Antigen, POC Negative Negative   Covid Antigen, POC Negative Negative    Assessment and Plan :   PDMP not reviewed this encounter.  1. Acute viral syndrome    Deferred imaging given clear cardiopulmonary exam,  hemodynamically stable vital signs. Suspect viral URI, viral syndrome. Physical exam findings reassuring and vital signs stable for discharge. Advised supportive care, offered symptomatic relief. Counseled patient on potential for adverse effects with medications prescribed/recommended today, ER and return-to-clinic precautions discussed, patient verbalized understanding.     Adolph Hoop, PA-C 02/12/24 1540

## 2024-02-12 NOTE — ED Triage Notes (Signed)
 Pt c/o cough, chest congestion, diarrhea, HA x 4 days-NAD-steady gait

## 2024-02-12 NOTE — Discharge Instructions (Signed)
 We will manage this as a viral illness. For sore throat or cough try using a honey-based tea. Use 3 teaspoons of honey with juice squeezed from half lemon. Place shaved pieces of ginger into 1/2-1 cup of water and warm over stove top. Then mix the ingredients and repeat every 4 hours as needed. Please take ibuprofen 600mg  every 6 hours with food alternating with OR taken together with Tylenol 500mg -650mg  every 6 hours for throat pain, fevers, aches and pains. Hydrate very well with at least 2 liters of water. Eat light meals such as soups (chicken and noodles, vegetable, chicken and wild rice).  Do not eat foods that you are allergic to.  Taking an antihistamine like Zyrtec (10mg  daily) can help against postnasal drainage, sinus congestion which can cause sinus pain, sinus headaches, throat pain, painful swallowing, coughing.  You can take this together with pseudoephedrine (Sudafed) at a dose of 30mg  3 times a day or twice daily as needed for the same kind of nasal drip, congestion.

## 2024-03-28 ENCOUNTER — Other Ambulatory Visit: Payer: Self-pay

## 2024-03-28 ENCOUNTER — Emergency Department (HOSPITAL_COMMUNITY)
Admission: EM | Admit: 2024-03-28 | Discharge: 2024-03-28 | Disposition: A | Discharge status: Home / Self Care | Attending: Emergency Medicine | Admitting: Emergency Medicine

## 2024-03-28 ENCOUNTER — Emergency Department (HOSPITAL_COMMUNITY)

## 2024-03-28 DIAGNOSIS — S8992XA Unspecified injury of left lower leg, initial encounter: Secondary | ICD-10-CM | POA: Insufficient documentation

## 2024-03-28 DIAGNOSIS — Z7982 Long term (current) use of aspirin: Secondary | ICD-10-CM | POA: Insufficient documentation

## 2024-03-28 DIAGNOSIS — X501XXA Overexertion from prolonged static or awkward postures, initial encounter: Secondary | ICD-10-CM | POA: Insufficient documentation

## 2024-03-28 DIAGNOSIS — M25562 Pain in left knee: Secondary | ICD-10-CM | POA: Diagnosis present
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
## 2024-03-28 NOTE — Discharge Instructions (Addendum)
 You are seen in the emergency department for concerns of knee pain.  Based on her mechanism and your exam, I do have concerns for possible ligamental injuries.  You were placed into a knee immobilizer and you should try to keep your leg elevated and use ice to help reduce swelling.  Take Tyle and ibuprofen  for pain.  Please follow-up with orthopedics for further evaluation if your symptoms or not improving.  For any concerns of new or worsening symptoms, return to the emergency department.

## 2024-03-28 NOTE — ED Provider Notes (Signed)
 [facility] AT [facility] Provider Note   CSN: [id number] Arrival date & time: 03/28/24  1836     Patient presents with: Knee Pain   Ann Haney is a 48 y.o. female.  Presents emergency department concerns of knee pain.  She reports that she was working when she quickly change positions and twisted her knee resulting in it becoming locked.  She states that she is having difficulty with walking because she has to keep her knee straight because of pain and feels like it needs to pop.  Denies any fall, direct impact, or obvious deformity.  No prior history of surgery in his knee.  She reports that she receives steroid injections in this knee routinely.   Knee Pain      Prior to Admission medications   Medication Sig Start Date End Date Taking? Authorizing Provider  acetaminophen  (TYLENOL ) 325 MG tablet Take 2 tablets (650 mg total) by mouth every 6 (six) hours as needed for moderate pain. 10/15/22   Christopher Savannah, PA-C  Aspirin-Salicylamide-Caffeine (ARTHRITIS STRENGTH BC POWDER PO) Take 1 packet by mouth every 6 (six) hours as needed (For knee pain.).  Patient not taking: Reported on 06/29/2020    [provider]  benzonatate  (TESSALON  PERLES) 100 MG capsule Take 2 capsules (200 mg total) by mouth 3 (three) times daily as needed for cough. 05/01/21   Sharps Ashley SAUNDERS, NP  busPIRone  (BUSPAR ) 5 MG tablet Take 1 tablet (5 mg total) by mouth 2 (two) times daily. Patient not taking: Reported on 06/29/2020 02/19/18   Tilford Bertram HERO, FNP  cetirizine  (ZYRTEC  ALLERGY) 10 MG tablet Take 1 tablet (10 mg total) by mouth daily. 02/12/24   Christopher Savannah, PA-C  ibuprofen  (ADVIL ) 600 MG tablet Take 1 tablet (600 mg total) by mouth every 6 (six) hours as needed. 02/12/24   Christopher Savannah, PA-C  loperamide  (IMODIUM ) 2 MG capsule Take 1 capsule (2 mg total) by mouth 2 (two) times daily as needed for diarrhea or loose stools. 02/12/24   Christopher Savannah, PA-C  Multiple  Vitamin (MULTIVITAMIN WITH MINERALS) TABS Take 1 tablet by mouth every morning.     [provider]  predniSONE  (DELTASONE ) 50 MG tablet Take 1 tab daily with breakfast for 3 days 05/08/21   Stuart Vernell Norris, PA-C  promethazine -dextromethorphan (PROMETHAZINE -DM) 6.25-15 MG/5ML syrup Take 5 mLs by mouth 3 (three) times daily as needed for cough. 02/12/24   Christopher Savannah, PA-C  pseudoephedrine  (SUDAFED) 30 MG tablet Take 1 tablet (30 mg total) by mouth every 8 (eight) hours as needed for congestion. 02/12/24   Christopher Savannah, PA-C  tamsulosin  (FLOMAX ) 0.4 MG CAPS capsule Take 1 capsule (0.4 mg total) by mouth daily after supper. 10/15/22   Christopher Savannah, PA-C    Allergies: Codeine    Review of Systems  Musculoskeletal:        Left knee pain  All other systems reviewed and are negative.   Updated Vital Signs BP (!) 142/97 (BP Location: Left Arm)   Pulse (!) 116   Temp 98.3 F (36.8 C) (Oral)   Resp 16   Ht 5' 5 (1.651 m)   Wt 104.3 kg   SpO2 98%   BMI 38.27 kg/m   Physical Exam Vitals and nursing note reviewed.  Constitutional:      General: She is not in acute distress.    Appearance: She is well-developed.  HENT:     Head: Normocephalic and atraumatic.  Eyes:  Conjunctiva/sclera: Conjunctivae normal.  Cardiovascular:     Rate and Rhythm: Normal rate and regular rhythm.     Heart sounds: No murmur heard. Pulmonary:     Effort: Pulmonary effort is normal. No respiratory distress.     Breath sounds: Normal breath sounds.  Abdominal:     Palpations: Abdomen is soft.     Tenderness: There is no abdominal tenderness.  Musculoskeletal:        General: Tenderness and signs of injury present. No swelling or deformity.     Cervical back: Neck supple.     Comments: ROM difficult to assess due to pain. Tenderness along the medial aspect of the left knee with varus stress. Modified McMurray due to pain with flexion of the knee is positive with severe joint line tenderness.   Skin:    General: Skin is warm and dry.     Capillary Refill: Capillary refill takes less than 2 seconds.  Neurological:     Mental Status: She is alert.  Psychiatric:        Mood and Affect: Mood normal.     (all labs ordered are listed, but only abnormal results are displayed) Labs Reviewed - No data to display  EKG: None  Radiology: DG Knee Complete 4 Views Left Result Date: 03/28/2024 CLINICAL DATA:  Left knee pain EXAM: LEFT KNEE - COMPLETE 4+ VIEW COMPARISON:  03/26/2023 FINDINGS: Tricompartment crash that tricompartment degenerative changes, most pronounced in the medial compartment with joint space narrowing and spurring. Findings similar to prior study. No joint effusion. No acute bony abnormality. Specifically, no fracture, subluxation, or dislocation. 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 No acute bony abnormality. Electronically Signed   By: Franky Crease M.D.   On: 03/28/2024 20:19     Procedures   Medications Ordered in the ED - No data to display                                  Medical Decision Making Amount and/or Complexity of Data Reviewed Radiology: ordered.   This patient presents to the ED for concern of knee pain.  Differential diagnosis includes patellar dislocation, avulsion fracture, joint effusion, tendinopathy, osteoarthritis, meniscal injury   Imaging Studies ordered:  I ordered imaging studies including x-ray of the left knee I independently visualized and interpreted imaging which showed moderate to advanced tricompartment degenerative changes. No acute bony abnormality. I agree with the radiologist interpretation   Problem List / ED Course:  Patient presents the emergency department concerns of left knee pain.  She reports that she was working when she twisted, change directions quickly and felt a locking/pop in her knee.  Denies any falls or other injury.  No prior surgery in this area. Examination  limited due to pain.  Modified McMurray reveals joint line tenderness with rotation of the lower leg.  No obvious patellar abnormality or malalignment.  Extension of flexion of the knee is difficult due to feelings of the knee being locked. Based on exam, I do have concerns for ligamental injury.  Will place patient to a knee immobilizer advised patient to follow-up with orthopedics.  Encouraged use of Tylenol  and ibuprofen .  Return precautions discussed such as concerns for worsening symptoms.  Discharged home in stable condition.  Final diagnoses:  Injury of left knee, initial encounter    ED Discharge Orders     None  Gillermo Poch A, PA-C 03/28/24 2116    Zackowski, Scott, MD 03/28/24 [phone]
